# Patient Record
Sex: Male | Born: 1970 | Race: White | Hispanic: Yes | Marital: Married | Smoking: Former smoker
Health system: Southern US, Community
[De-identification: ages and names within clinical notes are randomized; demographics above are authoritative.]

## PROBLEM LIST (undated history)

## (undated) DIAGNOSIS — E119 Type 2 diabetes mellitus without complications: Secondary | ICD-10-CM

## (undated) DIAGNOSIS — E785 Hyperlipidemia, unspecified: Secondary | ICD-10-CM

---

## 2004-01-24 ENCOUNTER — Ambulatory Visit: Payer: Self-pay | Admitting: Internal Medicine

## 2004-08-31 ENCOUNTER — Emergency Department (HOSPITAL_COMMUNITY): Admission: EM | Admit: 2004-08-31 | Discharge: 2004-08-31 | Payer: Self-pay | Admitting: Emergency Medicine

## 2013-04-23 ENCOUNTER — Ambulatory Visit: Payer: Managed Care, Other (non HMO)

## 2013-04-23 ENCOUNTER — Ambulatory Visit (INDEPENDENT_AMBULATORY_CARE_PROVIDER_SITE_OTHER): Payer: Managed Care, Other (non HMO) | Admitting: Family Medicine

## 2013-04-23 VITALS — BP 130/84 | HR 100 | Temp 99.0°F | Resp 16 | Ht 69.75 in | Wt 250.0 lb

## 2013-04-23 DIAGNOSIS — M25512 Pain in left shoulder: Secondary | ICD-10-CM

## 2013-04-23 DIAGNOSIS — M25519 Pain in unspecified shoulder: Secondary | ICD-10-CM

## 2013-04-23 DIAGNOSIS — Z0000 Encounter for general adult medical examination without abnormal findings: Secondary | ICD-10-CM

## 2013-04-23 LAB — COMPREHENSIVE METABOLIC PANEL
ALT: 32 U/L (ref 0–53)
AST: 22 U/L (ref 0–37)
Albumin: 4.7 g/dL (ref 3.5–5.2)
Alkaline Phosphatase: 64 U/L (ref 39–117)
BUN: 12 mg/dL (ref 6–23)
CO2: 27 mEq/L (ref 19–32)
Calcium: 10.3 mg/dL (ref 8.4–10.5)
Chloride: 102 mEq/L (ref 96–112)
Creat: 0.83 mg/dL (ref 0.50–1.35)
Glucose, Bld: 106 mg/dL — ABNORMAL HIGH (ref 70–99)
Potassium: 4.7 mEq/L (ref 3.5–5.3)
Sodium: 137 mEq/L (ref 135–145)
Total Bilirubin: 0.4 mg/dL (ref 0.3–1.2)
Total Protein: 7.9 g/dL (ref 6.0–8.3)

## 2013-04-23 LAB — LIPID PANEL
Cholesterol: 256 mg/dL — ABNORMAL HIGH (ref 0–200)
HDL: 35 mg/dL — ABNORMAL LOW (ref >39)
Total CHOL/HDL Ratio: 7.3 Ratio
Triglycerides: 404 mg/dL — ABNORMAL HIGH (ref <150)

## 2013-04-23 LAB — POCT URINALYSIS DIPSTICK
Bilirubin, UA: NEGATIVE
Blood, UA: NEGATIVE
Glucose, UA: NEGATIVE
Ketones, UA: NEGATIVE
Leukocytes, UA: NEGATIVE
Nitrite, UA: NEGATIVE
Protein, UA: NEGATIVE
Spec Grav, UA: <=1.005
Urobilinogen, UA: 0.2
pH, UA: 6

## 2013-04-23 LAB — POCT CBC
Granulocyte percent: 67.4 %G (ref 37–80)
HCT, POC: 52 % (ref 43.5–53.7)
Hemoglobin: 17.3 g/dL (ref 14.1–18.1)
Lymph, poc: 1.8 (ref 0.6–3.4)
MCH, POC: 31.5 pg — AB (ref 27–31.2)
MCHC: 33.3 g/dL (ref 31.8–35.4)
MCV: 94.8 fL (ref 80–97)
MID (cbc): 0.4 (ref 0–0.9)
MPV: 8.5 fL (ref 0–99.8)
POC Granulocyte: 4.5 (ref 2–6.9)
POC LYMPH PERCENT: 26.4 %L (ref 10–50)
POC MID %: 6.2 %M (ref 0–12)
Platelet Count, POC: 308 10*3/uL (ref 142–424)
RBC: 5.49 M/uL (ref 4.69–6.13)
RDW, POC: 13.8 %
WBC: 6.7 10*3/uL (ref 4.6–10.2)

## 2013-04-23 MED ORDER — METHYLPREDNISOLONE (PAK) 4 MG PO TABS: ORAL_TABLET | ORAL | Status: DC

## 2013-04-23 NOTE — Patient Instructions (Signed)
Return on Tuesday at 4 pm for recheck and physical

## 2013-04-23 NOTE — Progress Notes (Signed)
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 He's coming in for evaluation of left shoulder pain which has been going on since before Thanksgiving (2 months). It began when he was scratching his back.  Patient now has crepitus and pain with abduction and raising his arm over his head.  Objective: No acute distress Left shoulder exam reveals full range of motion although patient winces as he abducts the arm and raises his hand over his shoulder height. He also has faulty reaching behind his back and touching his scapula with his left hand.  The shoulder inspection appears normal and patient has no muscle wasting or weakness. There is no tenderness about the shoulder.  [facility] reading (PRIMARY) by  Dr. Milus GlazierLauenstein: Left shoulder. No dislocation but patient does have some extra calcification around the acromion superiorly.  Assessment: Capsulitis, left shoulder  Plan: Medrol dose pack, day out of work for 2 days and recheck on Tuesday  Also we'll do fasting cholesterol today.  Signed, Lewayne BuntingKurt Johnson M.D.

## 2013-04-25 ENCOUNTER — Ambulatory Visit (INDEPENDENT_AMBULATORY_CARE_PROVIDER_SITE_OTHER): Payer: Managed Care, Other (non HMO) | Admitting: Family Medicine

## 2013-04-25 VITALS — BP 120/80 | HR 88 | Temp 98.7°F | Resp 16 | Ht 70.0 in | Wt 255.0 lb

## 2013-04-25 DIAGNOSIS — E785 Hyperlipidemia, unspecified: Secondary | ICD-10-CM

## 2013-04-25 DIAGNOSIS — Z0000 Encounter for general adult medical examination without abnormal findings: Secondary | ICD-10-CM

## 2013-04-25 MED ORDER — ATORVASTATIN CALCIUM 20 MG PO TABS: 20.0000 mg | ORAL_TABLET | Freq: Every day | ORAL | Status: AC

## 2013-04-25 NOTE — Progress Notes (Signed)
Patient ID: Robel Wuertz MRN: [id number], DOB: [birth year] 43 y.o. Date of Encounter: 04/25/2013, 4:31 PM  Primary Physician: No primary provider on file.  Chief Complaint: Physical (CPE)  HPI: 43 y.o. y/o male with history noted below here for CPE.  Doing well. 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 He's had 2 months of left shoulder pain and recently we started him on a Medrol Dosepak which is significantly reduce his pain and crepitus. He has a history of elevated cholesterol triglycerides Review of Systems: Consitutional: No fever, chills, fatigue, night sweats, lymphadenopathy, or weight changes. Eyes: No visual changes, eye redness, or discharge. ENT/Mouth: Ears: No otalgia, tinnitus, hearing loss, discharge. Nose: No congestion, rhinorrhea, sinus pain, or epistaxis. Throat: No sore throat, post nasal drip, or teeth pain. Cardiovascular: No CP, palpitations, diaphoresis, DOE, edema, orthopnea, PND. Respiratory: No cough, hemoptysis, SOB, or wheezing. Gastrointestinal: No anorexia, dysphagia, reflux, pain, nausea, vomiting, hematemesis, diarrhea, constipation, BRBPR, or melena. Genitourinary: No dysuria, frequency, urgency, hematuria, incontinence, nocturia, decreased urinary stream, discharge, impotence, or testicular pain/masses. Musculoskeletal: No decreased ROM, myalgias, stiffness, joint swelling, or weakness. Skin: No rash, erythema, lesion changes, pain, warmth, jaundice, or pruritis. Neurological: No headache, dizziness, syncope, seizures, tremors, memory loss, coordination problems, or paresthesias. Psychological: No anxiety, depression, hallucinations, SI/HI. Endocrine: No fatigue, polydipsia, polyphagia, polyuria, or known diabetes. All other systems were reviewed and are otherwise negative.  History reviewed. No pertinent past medical history.   History reviewed. No pertinent past surgical history.  Home Meds:  Prior to Admission medications     Medication Sig Start Date End Date Taking? Authorizing Provider  methylPREDNIsolone (MEDROL DOSPACK) 4 MG tablet follow package directions 04/23/13  Yes Elvina Sidle, MD  aspirin 81 MG tablet Take 81 mg by mouth daily.    Historical Provider, MD    Allergies: No Known Allergies  History   Social History  . Marital Status: Married    Spouse Name: N/A    Number of Children: N/A  . Years of Education: N/A   Occupational History  . Not on file.   Social History Main Topics  . Smoking status: Former Games developer  . Smokeless tobacco: Not on file  . Alcohol Use: Not on file  . Drug Use: Not on file  . Sexual Activity: Not on file   Other Topics Concern  . Not on file   Social History Narrative  . No narrative on file    Family History  Problem Relation Age of Onset  . Diabetes Mother   . Diabetes Maternal Grandfather   . Diabetes Paternal Grandmother   . Heart disease Paternal Grandfather     Physical Exam: Blood pressure 120/80, pulse 88, temperature 98.7 F (37.1 C), temperature source Oral, resp. rate 16, height 5\' 10"  (1.778 m), weight 255 lb (115.667 kg), SpO2 98.00%.  General: Well developed, well nourished, in no acute distress. HEENT: Normocephalic, atraumatic. Conjunctiva pink, sclera non-icteric. Pupils 2 mm constricting to 1 mm, round, regular, and equally reactive to light and accomodation. EOMI. Internal auditory canal clear. TMs with good cone of light and without pathology. Nasal mucosa pink. Nares are without discharge. No sinus tenderness. Oral mucosa pink. Dentition: Loose crown on the right. Pharynx without exudate.   Neck: Supple. Trachea midline. No thyromegaly. Full ROM. No lymphadenopathy. Lungs: Clear to auscultation bilaterally without wheezes, rales, or rhonchi. Breathing is of normal effort and unlabored. Cardiovascular: RRR with S1 S2. No murmurs, rubs, or gallops appreciated. Distal pulses 2+ symmetrically.  No carotid or abdominal bruits Abdomen:  Soft, non-tender, non-distended with normoactive bowel sounds. No hepatosplenomegaly or masses. No rebound/guarding. No CVA tenderness. Without hernias.  Genitourinary:  uncircumcised male. No penile lesions. Testes descended bilaterally, and smooth without tenderness or masses.  Musculoskeletal: Full range of motion and 5/5 strength throughout. Without swelling, atrophy, tenderness, crepitus, or warmth. Extremities without clubbing, cyanosis, or edema. Calves supple. Skin: Warm and moist without erythema, ecchymosis, wounds, or rash. Neuro: A+Ox3. CN II-XII grossly intact. Moves all extremities spontaneously. Full sensation throughout. Normal gait. DTR 2+ throughout upper and lower extremities. Finger to nose intact. Psych:  Responds to questions appropriately with a normal affect.   Results for orders placed in visit on 04/23/13  COMPREHENSIVE METABOLIC PANEL      Result Value Range   Sodium 137  135 - 145 mEq/L   Potassium 4.7  3.5 - 5.3 mEq/L   Chloride 102  96 - 112 mEq/L   CO2 27  19 - 32 mEq/L   Glucose, Bld 106 (*) 70 - 99 mg/dL   BUN 12  6 - 23 mg/dL   Creat 4.09  8.11 - 9.14 mg/dL   Total Bilirubin 0.4  0.3 - 1.2 mg/dL   Alkaline Phosphatase 64  39 - 117 U/L   AST 22  0 - 37 U/L   ALT 32  0 - 53 U/L   Total Protein 7.9  6.0 - 8.3 g/dL   Albumin 4.7  3.5 - 5.2 g/dL   Calcium 78.2  8.4 - 95.6 mg/dL  LIPID PANEL      Result Value Range   Cholesterol 256 (*) 0 - 200 mg/dL   Triglycerides 213 (*) <150 mg/dL   HDL 35 (*) >08 mg/dL   Total CHOL/HDL Ratio 7.3     VLDL NOT CALC  0 - 40 mg/dL   LDL Cholesterol    0 - 99 mg/dL  POCT CBC      Result Value Range   WBC 6.7  4.6 - 10.2 K/uL   Lymph, poc 1.8  0.6 - 3.4   POC LYMPH PERCENT 26.4  10 - 50 %L   MID (cbc) 0.4  0 - 0.9   POC MID % 6.2  0 - 12 %M   POC Granulocyte 4.5  2 - 6.9   Granulocyte percent 67.4  37 - 80 %G   RBC 5.49  4.69 - 6.13 M/uL   Hemoglobin 17.3  14.1 - 18.1 g/dL   HCT, POC 65.7  84.6 - 53.7 %   MCV  94.8  80 - 97 fL   MCH, POC 31.5 (*) 27 - 31.2 pg   MCHC 33.3  31.8 - 35.4 g/dL   RDW, POC 96.2     Platelet Count, POC 308  142 - 424 K/uL   MPV 8.5  0 - 99.8 fL  POCT URINALYSIS DIPSTICK      Result Value Range   Color, UA yellow     Clarity, UA clear     Glucose, UA neg     Bilirubin, UA neg     Ketones, UA neg     Spec Grav, UA <=1.005     Blood, UA neg     pH, UA 6.0     Protein, UA neg     Urobilinogen, UA 0.2     Nitrite, UA neg     Leukocytes, UA Negative       Assessment/Plan:  43 y.o. y/o  male here  for CPE -Other and unspecified hyperlipidemia - Plan: atorvastatin (LIPITOR) 20 MG tablet Recheck 3 months Call if shoulder continues to be a problem Signed, Elvina SidleKurt Montasia Chisenhall, MD    Signed, Elvina SidleKurt Ramaya Guile, MD 04/25/2013 4:31 PM

## 2013-04-25 NOTE — Patient Instructions (Signed)
Health Maintenance, Males A healthy lifestyle and preventative care can promote health and wellness.  Maintain regular health, dental, and eye exams.  Eat a healthy diet. Foods like vegetables, fruits, whole grains, low-fat dairy products, and lean protein foods contain the nutrients you need and are low in calories. Decrease your intake of foods high in solid fats, added sugars, and salt. Get information about a proper diet from your health care provider, if necessary.  Regular physical exercise is one of the most important things you can do for your health. Most adults should get at least 150 minutes of moderate-intensity exercise (any activity that increases your heart rate and causes you to sweat) each week. In addition, most adults need muscle-strengthening exercises on 2 or more days a week.   Maintain a healthy weight. The body mass index (BMI) is a screening tool to identify possible weight problems. It provides an estimate of body fat based on height and weight. Your health care provider can find your BMI and can help you achieve or maintain a healthy weight. For males 20 years and older:  A BMI below 18.5 is considered underweight.  A BMI of 18.5 to 24.9 is normal.  A BMI of 25 to 29.9 is considered overweight.  A BMI of 30 and above is considered obese.  Maintain normal blood lipids and cholesterol by exercising and minimizing your intake of saturated fat. Eat a balanced diet with plenty of fruits and vegetables. Blood tests for lipids and cholesterol should begin at age 20 and be repeated every 5 years. If your lipid or cholesterol levels are high, you are over 50, or you are at high risk for heart disease, you may need your cholesterol levels checked more frequently.Ongoing high lipid and cholesterol levels should be treated with medicines, if diet and exercise are not working.  If you smoke, find out from your health care provider how to quit. If you do not use tobacco, do not  start.  Lung cancer screening is recommended for adults aged 55 80 years who are at high risk for developing lung cancer because of a history of smoking. A yearly low-dose CT scan of the lungs is recommended for people who have at least a 30-pack-year history of smoking and are a current smoker or have quit within the past 15 years. A pack year of smoking is smoking an average of 1 pack of cigarettes a day for 1 year (for example, a 30-pack-year history of smoking could mean smoking 1 pack a day for 30 years or 2 packs a day for 15 years). Yearly screening should continue until the smoker has stopped smoking for at least 15 years. Yearly screening should be stopped for people who develop a health problem that would prevent them from having lung cancer treatment.  If you choose to drink alcohol, do not have more than 2 drinks per day. One drink is considered to be 12 oz (360 mL) of beer, 5 oz (150 mL) of wine, or 1.5 oz (45 mL) of liquor.  Avoid use of street drugs. Do not share needles with anyone. Ask for help if you need support or instructions about stopping the use of drugs.  High blood pressure causes heart disease and increases the risk of stroke. Blood pressure should be checked at least every 1 2 years. Ongoing high blood pressure should be treated with medicines if weight loss and exercise are not effective.  If you are 45 45 years old, ask your health   care provider if you should take aspirin to prevent heart disease.  Diabetes screening involves taking a blood sample to check your fasting blood sugar level. This should be done once every 3 years after age 45, if you are at a normal weight and without risk factors for diabetes. Testing should be considered at a younger age or be carried out more frequently if you are overweight and have at least 1 risk factor for diabetes.  Colorectal cancer can be detected and often prevented. Most routine colorectal cancer screening begins at the age of 50  and continues through age 75. However, your health care provider may recommend screening at an earlier age if you have risk factors for colon cancer. On a yearly basis, your health care provider may provide home test kits to check for hidden blood in the stool. A small camera at the end of a tube may be used to directly examine the colon (sigmoidoscopy or colonoscopy) to detect the earliest forms of colorectal cancer. Talk to your health care provider about this at age 50, when routine screening begins. A direct exam of the colon should be repeated every 5 10 years through age 75, unless early forms of pre-cancerous polyps or small growths are found.  People who are at an increased risk for hepatitis B should be screened for this virus. You are considered at high risk for hepatitis B if:  You were born in a country where hepatitis B occurs often. Talk with your health care provider about which countries are considered high-risk.  Your parents were born in a high-risk country and you have not received a shot to protect against hepatitis B (hepatitis B vaccine).  You have HIV or AIDS.  You use needles to inject street drugs.  You live with, or have sex with, someone who has hepatitis B.  You are a man who has sex with other men (MSM).  You get hemodialysis treatment.  You take certain medicines for conditions like cancer, organ transplantation, and autoimmune conditions.  Hepatitis C blood testing is recommended for all people born from 1945 through 1965 and any individual with known risk factors for hepatitis C.  Healthy men should no longer receive prostate-specific antigen (PSA) blood tests as part of routine cancer screening. Talk to your health care provider about prostate cancer screening.  Testicular cancer screening is not recommended for adolescents or adult males who have no symptoms. Screening includes self-exam, a health care provider exam, and other screening tests. Consult with  your health care provider about any symptoms you have or any concerns you have about testicular cancer.  Practice safe sex. Use condoms and avoid high-risk sexual practices to reduce the spread of sexually transmitted infections (STIs).  Use sunscreen. Apply sunscreen liberally and repeatedly throughout the day. You should seek shade when your shadow is shorter than you. Protect yourself by wearing long sleeves, pants, a wide-brimmed hat, and sunglasses year round, whenever you are outdoors.  Tell your health care provider of new moles or changes in moles, especially if there is a change in shape or color. Also tell your provider if a mole is larger than the size of a pencil eraser.  A one-time screening for abdominal aortic aneurysm (AAA) and surgical repair of large AAAs by ultrasound is recommended for men aged 65 75 years who are current or former smokers.  Stay current with your vaccines (immunizations). Document Released: 09/26/2007 Document Revised: 01/18/2013 Document Reviewed: 08/25/2010 [employer] Patient Information 2014 [employer]   Hypercholesterolemia High Blood Cholesterol Cholesterol is a white, waxy, fat-like protein needed by your body in small amounts. The liver makes all the cholesterol you need. It is carried from the liver by the blood through the blood vessels. Deposits (plaque) may build up on blood vessel walls. This makes the arteries narrower and stiffer. Plaque increases the risk for heart attack and stroke. You cannot feel your cholesterol level even if it is very high. The only way to know is by a blood test to check your lipid (fats) levels. Once you know your cholesterol levels, you should keep a record of the test results. Work with your caregiver to to keep your levels in the desired range. WHAT THE RESULTS MEAN:  Total cholesterol is a rough measure of all the cholesterol in your blood.  LDL is the so-called bad cholesterol. This is the type that deposits  cholesterol in the walls of the arteries. You want this level to be low.  HDL is the good cholesterol because it cleans the arteries and carries the LDL away. You want this level to be high.  Triglycerides are fat that the body can either burn for energy or store. High levels are closely linked to heart disease. DESIRED LEVELS:  Total cholesterol below 200.  LDL below 100 for people at risk, below 70 for very high risk.  HDL above 50 is good, above 60 is best.  Triglycerides below 150. HOW TO LOWER YOUR CHOLESTEROL:  Diet.  Choose fish or white meat chicken and [place], roasted or baked. Limit fatty cuts of red meat, fried foods, and processed meats, such as sausage and lunch meat.  Eat lots of fresh fruits and vegetables. Choose whole grains, beans, pasta, potatoes and cereals.  Use only small amounts of olive, corn or canola oils. Avoid butter, mayonnaise, shortening or palm kernel oils. Avoid foods with trans-fats.  Use skim/nonfat milk and low-fat/nonfat yogurt and cheeses. Avoid whole milk, cream, ice cream, egg yolks and cheeses. Healthy desserts include angel food cake, gingersnaps, animal crackers, hard candy, popsicles, and low-fat/nonfat frozen yogurt. Avoid pastries, cakes, pies and cookies.  Exercise.  A regular program helps decrease LDL and raises HDL.  Helps with weight control.  Do things that increase your activity level like gardening, walking, or taking the stairs.  Medication.  May be prescribed by your caregiver to help lowering cholesterol and the risk for heart disease.  You may need medicine even if your levels are normal if you have several risk factors. HOME CARE INSTRUCTIONS   Follow your diet and exercise programs as suggested by your caregiver.  Take medications as directed.  Have blood work done when your caregiver feels it is necessary. MAKE SURE YOU:   Understand these instructions.  Will watch your condition.  Will get help right  away if you are not doing well or get worse. Document Released: 03/30/2005 Document Revised: 06/22/2011 Document Reviewed: 09/15/2006 [facility] Patient Information 2014 [place], [place]. Hypertriglyceridemia  Diet for High blood levels of Triglycerides Most fats in food are triglycerides. Triglycerides in your blood are stored as fat in your body. High levels of triglycerides in your blood may put you at a greater risk for heart disease and stroke.  Normal triglyceride levels are less than 150 mg/dL. Borderline high levels are 150-199 mg/dl. High levels are 200 - 499 mg/dL, and very high triglyceride levels are greater than 500 mg/dL. The decision to treat high triglycerides is generally based on the level. For people with borderline  or high triglyceride levels, treatment includes weight loss and exercise. Drugs are recommended for people with very high triglyceride levels. Many people who need treatment for high triglyceride levels have metabolic syndrome. This syndrome is a collection of disorders that often include: insulin resistance, high blood pressure, blood clotting problems, high cholesterol and triglycerides. TESTING PROCEDURE FOR TRIGLYCERIDES  You should not eat 4 hours before getting your triglycerides measured. The normal range of triglycerides is between 10 and 250 milligrams per deciliter (mg/dl). Some people may have extreme levels (1000 or above), but your triglyceride level may be too high if it is above 150 mg/dl, depending on what other risk factors you have for heart disease.  People with high blood triglycerides may also have high blood cholesterol levels. If you have high blood cholesterol as well as high blood triglycerides, your risk for heart disease is probably greater than if you only had high triglycerides. High blood cholesterol is one of the main risk factors for heart disease. CHANGING YOUR DIET  Your weight can affect your blood triglyceride level. If you are more than  20% above your ideal body weight, you may be able to lower your blood triglycerides by losing weight. Eating less and exercising regularly is the best way to combat this. Fat provides more calories than any other food. The best way to lose weight is to eat less fat. Only 30% of your total calories should come from fat. Less than 7% of your diet should come from saturated fat. A diet low in fat and saturated fat is the same as a diet to decrease blood cholesterol. By eating a diet lower in fat, you may lose weight, lower your blood cholesterol, and lower your blood triglyceride level.  Eating a diet low in fat, especially saturated fat, may also help you lower your blood triglyceride level. Ask your dietitian to help you figure how much fat you can eat based on the number of calories your caregiver has prescribed for you.  Exercise, in addition to helping with weight loss may also help lower triglyceride levels.   Alcohol can increase blood triglycerides. You may need to stop drinking alcoholic beverages.  Too much carbohydrate in your diet may also increase your blood triglycerides. Some complex carbohydrates are necessary in your diet. These may include bread, rice, potatoes, other starchy vegetables and cereals.  Reduce "simple" carbohydrates. These may include pure sugars, candy, honey, and jelly without losing other nutrients. If you have the kind of high blood triglycerides that is affected by the amount of carbohydrates in your diet, you will need to eat less sugar and less high-sugar foods. Your caregiver can help you with this.  Adding 2-4 grams of fish oil (EPA+ DHA) may also help lower triglycerides. Speak with your caregiver before adding any supplements to your regimen. Following the Diet  Maintain your ideal weight. Your caregivers can help you with a diet. Generally, eating less food and getting more exercise will help you lose weight. Joining a weight control group may also help. Ask your  caregivers for a good weight control group in your area.  Eat low-fat foods instead of high-fat foods. This can help you lose weight too.  These foods are lower in fat. Eat MORE of these:   Dried beans, peas, and lentils.  Egg whites.  Low-fat cottage cheese.  Fish.  Lean cuts of meat, such as round, sirloin, rump, and flank (cut extra fat off meat you fix).  Whole grain breads, cereals  and pasta.  Skim and nonfat dry milk.  Low-fat yogurt.  Poultry without the skin.  Cheese made with skim or part-skim milk, such as mozzarella, parmesan, farmers', ricotta, or pot cheese. These are higher fat foods. Eat LESS of these:   Whole milk and foods made from whole milk, such as American, blue, cheddar, monterey jack, and swiss cheese  High-fat meats, such as luncheon meats, sausages, knockwurst, bratwurst, hot dogs, ribs, corned beef, ground pork, and regular ground beef.  Fried foods. Limit saturated fats in your diet. Substituting unsaturated fat for saturated fat may decrease your blood triglyceride level. You will need to read package labels to know which products contain saturated fats.  These foods are high in saturated fat. Eat LESS of these:   Fried pork skins.  Whole milk.  Skin and fat from poultry.  Palm oil.  Butter.  Shortening.  Cream cheese.  Tomasa Blase.  Margarines and baked goods made from listed oils.  Vegetable shortenings.  Chitterlings.  Fat from meats.  Coconut oil.  Palm kernel oil.  Lard.  Cream.  Sour cream.  Fatback.  Coffee whiteners and non-dairy creamers made with these oils.  Cheese made from whole milk. Use unsaturated fats (both polyunsaturated and monounsaturated) moderately. Remember, even though unsaturated fats are better than saturated fats; you still want a diet low in total fat.  These foods are high in unsaturated fat:   Canola oil.  Sunflower oil.  Mayonnaise.  Almonds.  Peanuts.  Pine nuts.  Margarines  made with these oils.  Safflower oil.  Olive oil.  Avocados.  Cashews.  Peanut butter.  Sunflower seeds.  Soybean oil.  Peanut oil.  Olives.  Pecans.  Walnuts.  Pumpkin seeds. Avoid sugar and other high-sugar foods. This will decrease carbohydrates without decreasing other nutrients. Sugar in your food goes rapidly to your blood. When there is excess sugar in your blood, your liver may use it to make more triglycerides. Sugar also contains calories without other important nutrients.  Eat LESS of these:   Sugar, brown sugar, powdered sugar, jam, jelly, preserves, honey, syrup, molasses, pies, candy, cakes, cookies, frosting, pastries, colas, soft drinks, punches, fruit drinks, and regular gelatin.  Avoid alcohol. Alcohol, even more than sugar, may increase blood triglycerides. In addition, alcohol is high in calories and low in nutrients. Ask for sparkling water, or a diet soft drink instead of an alcoholic beverage. Suggestions for planning and preparing meals   Bake, broil, grill or roast meats instead of frying.  Remove fat from meats and skin from poultry before cooking.  Add spices, herbs, lemon juice or vinegar to vegetables instead of salt, rich sauces or gravies.  Use a non-stick skillet without fat or use no-stick sprays.  Cool and refrigerate stews and broth. Then remove the hardened fat floating on the surface before serving.  Refrigerate meat drippings and skim off fat to make low-fat gravies.  Serve more fish.  Use less butter, margarine and other high-fat spreads on bread or vegetables.  Use skim or reconstituted non-fat dry milk for cooking.  Cook with low-fat cheeses.  Substitute low-fat yogurt or cottage cheese for all or part of the sour cream in recipes for sauces, dips or congealed salads.  Use half yogurt/half mayonnaise in salad recipes.  Substitute evaporated skim milk for cream. Evaporated skim milk or reconstituted non-fat dry milk can  be whipped and substituted for whipped cream in certain recipes.  Choose fresh fruits for dessert instead of high-fat foods such  as pies or cakes. Fruits are naturally low in fat. When Dining Out   Order low-fat appetizers such as fruit or vegetable juice, pasta with vegetables or tomato sauce.  Select clear, rather than cream soups.  Ask that dressings and gravies be served on the side. Then use less of them.  Order foods that are baked, broiled, poached, steamed, stir-fried, or roasted.  Ask for margarine instead of butter, and use only a small amount.  Drink sparkling water, unsweetened tea or coffee, or diet soft drinks instead of alcohol or other sweet beverages. QUESTIONS AND ANSWERS ABOUT OTHER FATS IN THE BLOOD: SATURATED FAT, TRANS FAT, AND CHOLESTEROL What is trans fat? Trans fat is a type of fat that is formed when vegetable oil is hardened through a process called hydrogenation. This process helps makes foods more solid, gives them shape, and prolongs their shelf life. Trans fats are also called hydrogenated or partially hydrogenated oils.  What do saturated fat, trans fat, and cholesterol in foods have to do with heart disease? Saturated fat, trans fat, and cholesterol in the diet all raise the level of LDL "bad" cholesterol in the blood. The higher the LDL cholesterol, the greater the risk for coronary heart disease (CHD). Saturated fat and trans fat raise LDL similarly.  What foods contain saturated fat, trans fat, and cholesterol? High amounts of saturated fat are found in animal products, such as fatty cuts of meat, chicken skin, and full-fat dairy products like butter, whole milk, cream, and cheese, and in tropical vegetable oils such as palm, palm kernel, and coconut oil. Trans fat is found in some of the same foods as saturated fat, such as vegetable shortening, some margarines (especially hard or stick margarine), crackers, cookies, baked goods, fried foods, salad  dressings, and other processed foods made with partially hydrogenated vegetable oils. Small amounts of trans fat also occur naturally in some animal products, such as milk products, beef, and lamb. Foods high in cholesterol include liver, other organ meats, egg yolks, shrimp, and full-fat dairy products. How can I use the new food label to make heart-healthy food choices? Check the Nutrition Facts panel of the food label. Choose foods lower in saturated fat, trans fat, and cholesterol. For saturated fat and cholesterol, you can also use the Percent Daily Value (%DV): 5% DV or less is low, and 20% DV or more is high. (There is no %DV for trans fat.) Use the Nutrition Facts panel to choose foods low in saturated fat and cholesterol, and if the trans fat is not listed, read the ingredients and limit products that list shortening or hydrogenated or partially hydrogenated vegetable oil, which tend to be high in trans fat. POINTS TO REMEMBER:   Discuss your risk for heart disease with your caregivers, and take steps to reduce risk factors.  Change your diet. Choose foods that are low in saturated fat, trans fat, and cholesterol.  Add exercise to your daily routine if it is not already being done. Participate in physical activity of moderate intensity, like brisk walking, for at least 30 minutes on most, and preferably all days of the week. No time? Break the 30 minutes into three, 10-minute segments during the day.  Stop smoking. If you do smoke, contact your caregiver to discuss ways in which they can help you quit.  Do not use street drugs.  Maintain a normal weight.  Maintain a healthy blood pressure.  Keep up with your blood work for checking the fats in your  blood as directed by your caregiver. Document Released: 01/16/2004 Document Revised: 09/29/2011 Document Reviewed: 08/13/2008 [facility] Patient Information 2014 [place], [place].   Results for orders placed in visit on 04/23/13    COMPREHENSIVE METABOLIC PANEL      Result Value Range   Sodium 137  135 - 145 mEq/L   Potassium 4.7  3.5 - 5.3 mEq/L   Chloride 102  96 - 112 mEq/L   CO2 27  19 - 32 mEq/L   Glucose, Bld 106 (*) 70 - 99 mg/dL   BUN 12  6 - 23 mg/dL   Creat 4.540.83  0.980.50 - 1.191.35 mg/dL   Total Bilirubin 0.4  0.3 - 1.2 mg/dL   Alkaline Phosphatase 64  39 - 117 U/L   AST 22  0 - 37 U/L   ALT 32  0 - 53 U/L   Total Protein 7.9  6.0 - 8.3 g/dL   Albumin 4.7  3.5 - 5.2 g/dL   Calcium 14.710.3  8.4 - 82.910.5 mg/dL  LIPID PANEL      Result Value Range   Cholesterol 256 (*) 0 - 200 mg/dL   Triglycerides 562404 (*) <150 mg/dL   HDL 35 (*) >13>39 mg/dL   Total CHOL/HDL Ratio 7.3     VLDL NOT CALC  0 - 40 mg/dL   LDL Cholesterol    0 - 99 mg/dL  POCT CBC      Result Value Range   WBC 6.7  4.6 - 10.2 K/uL   Lymph, poc 1.8  0.6 - 3.4   POC LYMPH PERCENT 26.4  10 - 50 %L   MID (cbc) 0.4  0 - 0.9   POC MID % 6.2  0 - 12 %M   POC Granulocyte 4.5  2 - 6.9   Granulocyte percent 67.4  37 - 80 %G   RBC 5.49  4.69 - 6.13 M/uL   Hemoglobin 17.3  14.1 - 18.1 g/dL   HCT, POC 08.652.0  57.843.5 - 53.7 %   MCV 94.8  80 - 97 fL   MCH, POC 31.5 (*) 27 - 31.2 pg   MCHC 33.3  31.8 - 35.4 g/dL   RDW, POC 46.913.8     Platelet Count, POC 308  142 - 424 K/uL   MPV 8.5  0 - 99.8 fL  POCT URINALYSIS DIPSTICK      Result Value Range   Color, UA yellow     Clarity, UA clear     Glucose, UA neg     Bilirubin, UA neg     Ketones, UA neg     Spec Grav, UA <=1.005     Blood, UA neg     pH, UA 6.0     Protein, UA neg     Urobilinogen, UA 0.2     Nitrite, UA neg     Leukocytes, UA Negative

## 2014-02-23 DIAGNOSIS — E119 Type 2 diabetes mellitus without complications: Secondary | ICD-10-CM | POA: Insufficient documentation

## 2015-04-18 ENCOUNTER — Ambulatory Visit (INDEPENDENT_AMBULATORY_CARE_PROVIDER_SITE_OTHER): Payer: Managed Care, Other (non HMO) | Admitting: Emergency Medicine

## 2015-04-18 ENCOUNTER — Ambulatory Visit (INDEPENDENT_AMBULATORY_CARE_PROVIDER_SITE_OTHER): Payer: Managed Care, Other (non HMO)

## 2015-04-18 VITALS — BP 134/88 | HR 89 | Temp 98.4°F | Resp 16 | Ht 70.0 in | Wt 256.4 lb

## 2015-04-18 DIAGNOSIS — M722 Plantar fascial fibromatosis: Secondary | ICD-10-CM | POA: Diagnosis not present

## 2015-04-18 DIAGNOSIS — F17200 Nicotine dependence, unspecified, uncomplicated: Secondary | ICD-10-CM | POA: Diagnosis not present

## 2015-04-18 DIAGNOSIS — F17210 Nicotine dependence, cigarettes, uncomplicated: Secondary | ICD-10-CM | POA: Insufficient documentation

## 2015-04-18 MED ORDER — NAPROXEN SODIUM 550 MG PO TABS: 550.0000 mg | ORAL_TABLET | Freq: Two times a day (BID) | ORAL | Status: AC

## 2015-04-18 MED ORDER — TRAMADOL HCL 50 MG PO TABS
50.0000 mg | ORAL_TABLET | Freq: Three times a day (TID) | ORAL | Status: DC | PRN
Start: 2015-04-18 — End: 2020-07-29

## 2015-04-18 NOTE — Patient Instructions (Signed)
Plantar Fasciitis Plantar fasciitis is a painful foot condition that affects the heel. It occurs when the band of tissue that connects the toes to the heel bone (plantar fascia) becomes irritated. This can happen after exercising too much or doing other repetitive activities (overuse injury). The pain from plantar fasciitis can range from mild irritation to severe pain that makes it difficult for you to walk or move. The pain is usually worse in the morning or after you have been sitting or lying down for a while. CAUSES This condition may be caused by:  Standing for long periods of time.  Wearing shoes that do not fit.  Doing high-impact activities, including running, aerobics, and ballet.  Being overweight.  Having an abnormal way of walking (gait).  Having tight calf muscles.  Having high arches in your feet.  Starting a new athletic activity. SYMPTOMS The main symptom of this condition is heel pain. Other symptoms include:  Pain that gets worse after activity or exercise.  Pain that is worse in the morning or after resting.  Pain that goes away after you walk for a few minutes. DIAGNOSIS This condition may be diagnosed based on your signs and symptoms. Your health care provider will also do a physical exam to check for:  A tender area on the bottom of your foot.  A high arch in your foot.  Pain when you move your foot.  Difficulty moving your foot. You may also need to have imaging studies to confirm the diagnosis. These can include:  X-rays.  Ultrasound.  MRI. TREATMENT  Treatment for plantar fasciitis depends on the severity of the condition. Your treatment may include:  Rest, ice, and over-the-counter pain medicines to manage your pain.  Exercises to stretch your calves and your plantar fascia.  A splint that holds your foot in a stretched, upward position while you sleep (night splint).  Physical therapy to relieve symptoms and prevent problems in the  future.  Cortisone injections to relieve severe pain.  Extracorporeal shock wave therapy (ESWT) to stimulate damaged plantar fascia with electrical impulses. It is often used as a last resort before surgery.  Surgery, if other treatments have not worked after 12 months. HOME CARE INSTRUCTIONS  Take medicines only as directed by your health care provider.  Avoid activities that cause pain.  Roll the bottom of your foot over a bag of ice or a bottle of cold water. Do this for 20 minutes, 3-4 times a day.  Perform simple stretches as directed by your health care provider.  Try wearing athletic shoes with air-sole or gel-sole cushions or soft shoe inserts.  Wear a night splint while sleeping, if directed by your health care provider.  Keep all follow-up appointments with your health care provider. PREVENTION   Do not perform exercises or activities that cause heel pain.  Consider finding low-impact activities if you continue to have problems.  Lose weight if you need to. The best way to prevent plantar fasciitis is to avoid the activities that aggravate your plantar fascia. SEEK MEDICAL CARE IF:  Your symptoms do not go away after treatment with home care measures.  Your pain gets worse.  Your pain affects your ability to move or do your daily activities.   This information is not intended to replace advice given to you by your health care provider. Make sure you discuss any questions you have with your health care provider.   Document Released: 12/23/2000 Document Revised: 12/19/2014 Document Reviewed: 02/07/2014 Elsevier   Interactive Patient Education 2016 [employer]  

## 2015-04-18 NOTE — Progress Notes (Signed)
Subjective:  Patient ID: Scott Duarte, male    DOB: 03-25-1971  Age: 45 y.o. MRN: [id number]  CC: Heel pain   HPI Scott Duarte presents  is been under treatment by family doctor for "plantar fasciitis." As pain in the morning that is severe when he bears weight but the pain diminishes but doesn't go away the rest of day. Pain is located in his left heel. He has no history of injury or overuse. Denies any improvement with over with the medications prescribed by his regular doctor. He'Duarte been using supportive techniques like ice and rolling on a bottle without any improvement.  History Scott Duarte has no past medical history on file.   He has no past surgical history on file.   His  family history includes Diabetes in his maternal grandfather, mother, and paternal grandmother; Heart disease in his paternal grandfather.  He   reports that he has quit smoking. He does not have any smokeless tobacco history on file. His alcohol and drug histories are not on file.  Outpatient Prescriptions Prior to Visit  Medication Sig Dispense Refill  . aspirin 81 MG tablet Take 81 mg by mouth daily. Reported on 04/18/2015    . atorvastatin (LIPITOR) 20 MG tablet Take 1 tablet (20 mg total) by mouth daily. (Patient not taking: Reported on 04/18/2015) 90 tablet 3  . methylPREDNIsolone (MEDROL DOSPACK) 4 MG tablet follow package directions (Patient not taking: Reported on 04/18/2015) 21 tablet 0   No facility-administered medications prior to visit.    Social History   Social History  . Marital Status: Married    Spouse Name: N/A  . Number of Children: N/A  . Years of Education: N/A   Social History Main Topics  . Smoking status: Former Games developer  . Smokeless tobacco: None  . Alcohol Use: None  . Drug Use: None  . Sexual Activity: Not Asked   Other Topics Concern  . None   Social History Narrative     Review of Systems  Constitutional: Negative for fever, chills and appetite change.  HENT: Negative for  congestion, ear pain, postnasal drip, sinus pressure and sore throat.   Eyes: Negative for pain and redness.  Respiratory: Negative for cough, shortness of breath and wheezing.   Cardiovascular: Negative for leg swelling.  Gastrointestinal: Negative for nausea, vomiting, abdominal pain, diarrhea, constipation and blood in stool.  Endocrine: Negative for polyuria.  Genitourinary: Negative for dysuria, urgency, frequency and flank pain.  Musculoskeletal: Negative for gait problem.  Skin: Negative for rash.  Neurological: Negative for weakness and headaches.  Psychiatric/Behavioral: Negative for confusion and decreased concentration. The patient is not nervous/anxious.     Objective:  BP 134/88 mmHg  Pulse 89  Temp(Src) 98.4 F (36.9 C) (Oral)  Resp 16  Ht 5\' 10"  (1.778 m)  Wt 256 lb 6.4 oz (116.302 kg)  BMI 36.79 kg/m2  SpO2 99%  Physical Exam  Constitutional: He is oriented to person, place, and time. He appears well-developed and well-nourished.  HENT:  Head: Normocephalic and atraumatic.  Eyes: Conjunctivae are normal. Pupils are equal, round, and reactive to light.  Pulmonary/Chest: Effort normal.  Musculoskeletal: He exhibits no edema.  Neurological: He is alert and oriented to person, place, and time.  Skin: Skin is dry.  Psychiatric: He has a normal mood and affect. His behavior is normal. Thought content normal.      Assessment & Plan:   Scott Duarte was seen today for heel pain.  Diagnoses and all orders for  this visit:  Plantar fasciitis, left -     DG Foot Complete Left; Future -     Ambulatory referral to Podiatry  Tobacco use disorder  Other orders -     naproxen sodium (ANAPROX DS) 550 MG tablet; Take 1 tablet (550 mg total) by mouth 2 (two) times daily with a meal. -     traMADol (ULTRAM) 50 MG tablet; Take 1 tablet (50 mg total) by mouth every 8 (eight) hours as needed.  I am having Mr. Scott Duarte start on naproxen sodium and traMADol. I am also having him  maintain his aspirin, methylPREDNIsolone, atorvastatin, metFORMIN, and fenofibrate.  Meds ordered this encounter  Medications  . metFORMIN (GLUMETZA) 500 MG (MOD) 24 hr tablet    Sig: Take 500 mg by mouth daily with breakfast.  . fenofibrate (TRICOR) 145 MG tablet    Sig: Take 145 mg by mouth daily.  . naproxen sodium (ANAPROX DS) 550 MG tablet    Sig: Take 1 tablet (550 mg total) by mouth 2 (two) times daily with a meal.    Dispense:  40 tablet    Refill:  0  . traMADol (ULTRAM) 50 MG tablet    Sig: Take 1 tablet (50 mg total) by mouth every 8 (eight) hours as needed.    Dispense:  30 tablet    Refill:  0   he was treated with medication referred to podiatry   Appropriate red flag conditions were discussed with the patient as well as actions that should be taken.  Patient expressed his understanding.  Follow-up: Return if symptoms worsen or fail to improve.  Carmelina DaneAnderson, Scott S, MD   [facility] reading (PRIMARY) by  Dr. Dareen PianoAnderson.  negative.

## 2019-06-13 ENCOUNTER — Encounter: Payer: Self-pay | Admitting: Nurse Practitioner

## 2020-07-27 ENCOUNTER — Emergency Department (HOSPITAL_COMMUNITY): Payer: Managed Care, Other (non HMO)

## 2020-07-27 ENCOUNTER — Inpatient Hospital Stay (HOSPITAL_COMMUNITY): Payer: Managed Care, Other (non HMO)

## 2020-07-27 ENCOUNTER — Inpatient Hospital Stay (HOSPITAL_COMMUNITY)
Admission: EM | Admit: 2020-07-27 | Discharge: 2020-07-29 | DRG: 254 | Disposition: A | Discharge status: Home / Self Care | Payer: Managed Care, Other (non HMO) | Attending: Vascular Surgery | Admitting: Vascular Surgery

## 2020-07-27 ENCOUNTER — Encounter (HOSPITAL_COMMUNITY): Payer: Self-pay | Admitting: Emergency Medicine

## 2020-07-27 ENCOUNTER — Other Ambulatory Visit: Payer: Self-pay

## 2020-07-27 DIAGNOSIS — Z8249 Family history of ischemic heart disease and other diseases of the circulatory system: Secondary | ICD-10-CM | POA: Diagnosis not present

## 2020-07-27 DIAGNOSIS — Z7982 Long term (current) use of aspirin: Secondary | ICD-10-CM | POA: Diagnosis not present

## 2020-07-27 DIAGNOSIS — I724 Aneurysm of artery of lower extremity: Secondary | ICD-10-CM | POA: Diagnosis present

## 2020-07-27 DIAGNOSIS — M79662 Pain in left lower leg: Secondary | ICD-10-CM | POA: Diagnosis present

## 2020-07-27 DIAGNOSIS — M79605 Pain in left leg: Secondary | ICD-10-CM

## 2020-07-27 DIAGNOSIS — E785 Hyperlipidemia, unspecified: Secondary | ICD-10-CM | POA: Diagnosis present

## 2020-07-27 DIAGNOSIS — Z01810 Encounter for preprocedural cardiovascular examination: Secondary | ICD-10-CM

## 2020-07-27 DIAGNOSIS — I70245 Atherosclerosis of native arteries of left leg with ulceration of other part of foot: Secondary | ICD-10-CM

## 2020-07-27 DIAGNOSIS — M79669 Pain in unspecified lower leg: Secondary | ICD-10-CM

## 2020-07-27 DIAGNOSIS — Z79899 Other long term (current) drug therapy: Secondary | ICD-10-CM

## 2020-07-27 DIAGNOSIS — Z20822 Contact with and (suspected) exposure to covid-19: Secondary | ICD-10-CM | POA: Diagnosis present

## 2020-07-27 DIAGNOSIS — Z833 Family history of diabetes mellitus: Secondary | ICD-10-CM | POA: Diagnosis not present

## 2020-07-27 DIAGNOSIS — Z87891 Personal history of nicotine dependence: Secondary | ICD-10-CM | POA: Diagnosis not present

## 2020-07-27 DIAGNOSIS — Z7984 Long term (current) use of oral hypoglycemic drugs: Secondary | ICD-10-CM

## 2020-07-27 HISTORY — DX: Type 2 diabetes mellitus without complications: E11.9

## 2020-07-27 HISTORY — DX: Hyperlipidemia, unspecified: E78.5

## 2020-07-27 LAB — CBC WITH DIFFERENTIAL/PLATELET
Abs Immature Granulocytes: 0.04 10*3/uL (ref 0.00–0.07)
Basophils Absolute: 0.1 10*3/uL (ref 0.0–0.1)
Basophils Relative: 1 %
Eosinophils Absolute: 0.1 10*3/uL (ref 0.0–0.5)
Eosinophils Relative: 1 %
HCT: 45.9 % (ref 39.0–52.0)
Hemoglobin: 15.9 g/dL (ref 13.0–17.0)
Immature Granulocytes: 0 %
Lymphocytes Relative: 23 %
Lymphs Abs: 2.3 10*3/uL (ref 0.7–4.0)
MCH: 31.1 pg (ref 26.0–34.0)
MCHC: 34.6 g/dL (ref 30.0–36.0)
MCV: 89.8 fL (ref 80.0–100.0)
Monocytes Absolute: 0.7 10*3/uL (ref 0.1–1.0)
Monocytes Relative: 7 %
Neutro Abs: 6.8 10*3/uL (ref 1.7–7.7)
Neutrophils Relative %: 68 %
Platelets: 248 10*3/uL (ref 150–400)
RBC: 5.11 MIL/uL (ref 4.22–5.81)
RDW: 13.1 % (ref 11.5–15.5)
WBC: 10 10*3/uL (ref 4.0–10.5)
nRBC: 0 % (ref 0.0–0.2)

## 2020-07-27 LAB — COMPREHENSIVE METABOLIC PANEL
ALT: 31 U/L (ref 0–44)
AST: 27 U/L (ref 15–41)
Albumin: 4.1 g/dL (ref 3.5–5.0)
Alkaline Phosphatase: 54 U/L (ref 38–126)
Anion gap: 11 (ref 5–15)
BUN: 10 mg/dL (ref 6–20)
CO2: 19 mmol/L — ABNORMAL LOW (ref 22–32)
Calcium: 9.5 mg/dL (ref 8.9–10.3)
Chloride: 106 mmol/L (ref 98–111)
Creatinine, Ser: 0.65 mg/dL (ref 0.61–1.24)
GFR, Estimated: >60 mL/min (ref >60)
Glucose, Bld: 154 mg/dL — ABNORMAL HIGH (ref 70–99)
Potassium: 4.3 mmol/L (ref 3.5–5.1)
Sodium: 136 mmol/L (ref 135–145)
Total Bilirubin: 1 mg/dL (ref 0.3–1.2)
Total Protein: 7 g/dL (ref 6.5–8.1)

## 2020-07-27 LAB — GLUCOSE, CAPILLARY
Glucose-Capillary: 132 mg/dL — ABNORMAL HIGH (ref 70–99)
Glucose-Capillary: 192 mg/dL — ABNORMAL HIGH (ref 70–99)

## 2020-07-27 LAB — URINALYSIS, ROUTINE W REFLEX MICROSCOPIC
Bilirubin Urine: NEGATIVE
Glucose, UA: NEGATIVE mg/dL
Hgb urine dipstick: NEGATIVE
Ketones, ur: NEGATIVE mg/dL
Leukocytes,Ua: NEGATIVE
Nitrite: NEGATIVE
Protein, ur: NEGATIVE mg/dL
Specific Gravity, Urine: 1.036 — ABNORMAL HIGH (ref 1.005–1.030)
pH: 5 (ref 5.0–8.0)

## 2020-07-27 LAB — RESP PANEL BY RT-PCR (FLU A&B, COVID) ARPGX2
Influenza A by PCR: NEGATIVE
Influenza B by PCR: NEGATIVE
SARS Coronavirus 2 by RT PCR: NEGATIVE

## 2020-07-27 LAB — HEPARIN LEVEL (UNFRACTIONATED): Heparin Unfractionated: 0.12 IU/mL — ABNORMAL LOW (ref 0.30–0.70)

## 2020-07-27 LAB — SURGICAL PCR SCREEN
MRSA, PCR: NEGATIVE
Staphylococcus aureus: NEGATIVE

## 2020-07-27 MED ORDER — PANTOPRAZOLE SODIUM 40 MG PO TBEC
40.0000 mg | DELAYED_RELEASE_TABLET | Freq: Every day | ORAL | Status: DC
  Administered 2020-07-27 – 2020-07-29 (×3): 40 mg via ORAL
  Filled 2020-07-27 (×3): qty 1

## 2020-07-27 MED ORDER — HEPARIN BOLUS VIA INFUSION
3000.0000 [IU] | Freq: Once | INTRAVENOUS | Status: AC
  Administered 2020-07-27: 3000 [IU] via INTRAVENOUS
  Filled 2020-07-27: qty 3000

## 2020-07-27 MED ORDER — IOHEXOL 350 MG/ML SOLN
100.0000 mL | Freq: Once | INTRAVENOUS | Status: AC | PRN
  Administered 2020-07-27: 100 mL via INTRAVENOUS

## 2020-07-27 MED ORDER — ATORVASTATIN CALCIUM 10 MG PO TABS
20.0000 mg | ORAL_TABLET | Freq: Every day | ORAL | Status: DC
  Administered 2020-07-27 – 2020-07-29 (×3): 20 mg via ORAL
  Filled 2020-07-27 (×3): qty 2

## 2020-07-27 MED ORDER — INSULIN ASPART 100 UNIT/ML ~~LOC~~ SOLN
4.0000 [IU] | Freq: Three times a day (TID) | SUBCUTANEOUS | Status: DC
  Administered 2020-07-28 – 2020-07-29 (×3): 4 [IU] via SUBCUTANEOUS

## 2020-07-27 MED ORDER — INSULIN ASPART 100 UNIT/ML ~~LOC~~ SOLN: 0.0000 [IU] | Freq: Every day | SUBCUTANEOUS | Status: DC

## 2020-07-27 MED ORDER — ONDANSETRON HCL 4 MG/2ML IJ SOLN: 4.0000 mg | Freq: Four times a day (QID) | INTRAMUSCULAR | Status: DC | PRN

## 2020-07-27 MED ORDER — SODIUM CHLORIDE 0.9 % IV SOLN: INTRAVENOUS | Status: DC

## 2020-07-27 MED ORDER — POTASSIUM CHLORIDE CRYS ER 20 MEQ PO TBCR: 20.0000 meq | EXTENDED_RELEASE_TABLET | Freq: Once | ORAL | Status: DC

## 2020-07-27 MED ORDER — INSULIN ASPART 100 UNIT/ML ~~LOC~~ SOLN
0.0000 [IU] | Freq: Three times a day (TID) | SUBCUTANEOUS | Status: DC
  Administered 2020-07-27: 3 [IU] via SUBCUTANEOUS
  Administered 2020-07-28: 4 [IU] via SUBCUTANEOUS
  Administered 2020-07-28: 7 [IU] via SUBCUTANEOUS
  Administered 2020-07-28 – 2020-07-29 (×2): 4 [IU] via SUBCUTANEOUS

## 2020-07-27 MED ORDER — METOPROLOL TARTRATE 5 MG/5ML IV SOLN: 2.0000 mg | INTRAVENOUS | Status: DC | PRN

## 2020-07-27 MED ORDER — MORPHINE SULFATE (PF) 2 MG/ML IV SOLN: 2.0000 mg | INTRAVENOUS | Status: DC | PRN

## 2020-07-27 MED ORDER — ALUM & MAG HYDROXIDE-SIMETH 200-200-20 MG/5ML PO SUSP: 15.0000 mL | ORAL | Status: DC | PRN

## 2020-07-27 MED ORDER — HEPARIN (PORCINE) 25000 UT/250ML-% IV SOLN
2100.0000 [IU]/h | INTRAVENOUS | Status: DC
  Administered 2020-07-27: 1800 [IU]/h via INTRAVENOUS
  Administered 2020-07-27: 1500 [IU]/h via INTRAVENOUS
  Filled 2020-07-27 (×2): qty 250

## 2020-07-27 MED ORDER — HYDRALAZINE HCL 20 MG/ML IJ SOLN: 5.0000 mg | INTRAMUSCULAR | Status: DC | PRN

## 2020-07-27 MED ORDER — LABETALOL HCL 5 MG/ML IV SOLN: 10.0000 mg | INTRAVENOUS | Status: DC | PRN

## 2020-07-27 MED ORDER — PHENOL 1.4 % MT LIQD
1.0000 | OROMUCOSAL | Status: DC | PRN
  Filled 2020-07-27: qty 177

## 2020-07-27 MED ORDER — ASPIRIN EC 81 MG PO TBEC
81.0000 mg | DELAYED_RELEASE_TABLET | Freq: Every day | ORAL | Status: DC
  Administered 2020-07-27 – 2020-07-29 (×3): 81 mg via ORAL
  Filled 2020-07-27 (×3): qty 1

## 2020-07-27 MED ORDER — OXYCODONE-ACETAMINOPHEN 5-325 MG PO TABS
1.0000 | ORAL_TABLET | ORAL | Status: DC | PRN
  Administered 2020-07-28 – 2020-07-29 (×3): 2 via ORAL
  Filled 2020-07-27 (×3): qty 2

## 2020-07-27 MED ORDER — GUAIFENESIN-DM 100-10 MG/5ML PO SYRP: 15.0000 mL | ORAL_SOLUTION | ORAL | Status: DC | PRN

## 2020-07-27 MED ORDER — CEFAZOLIN SODIUM-DEXTROSE 2-4 GM/100ML-% IV SOLN
2.0000 g | INTRAVENOUS | Status: AC
  Administered 2020-07-28: 2 g via INTRAVENOUS
  Filled 2020-07-27 (×2): qty 100

## 2020-07-27 MED ORDER — HEPARIN BOLUS VIA INFUSION
5700.0000 [IU] | Freq: Once | INTRAVENOUS | Status: AC
  Administered 2020-07-27: 5700 [IU] via INTRAVENOUS
  Filled 2020-07-27: qty 5700

## 2020-07-27 NOTE — ED Notes (Signed)
Patient transported to CT by this RN 

## 2020-07-27 NOTE — Progress Notes (Signed)
ANTICOAGULATION CONSULT NOTE  Pharmacy Consult for heparin Indication: DVT  No Known Allergies  Patient Measurements: Weight: 108 kg (238 lb) Heparin Dosing Weight: 95kg  Vital Signs: Temp: 97.8 F (36.6 C) (04/16 2023) Temp Source: Oral (04/16 2023) BP: 130/91 (04/16 2023) Pulse Rate: 63 (04/16 2023)  Labs: Recent Labs    07/27/20 1124 07/27/20 2202  HGB 15.9  --   HCT 45.9  --   PLT 248  --   HEPARINUNFRC  --  0.12*  CREATININE 0.65  --     CrCl cannot be calculated (Unknown ideal weight.).   Medical History: History reviewed. No pertinent past medical history.  Assessment: 44 YOM presenting with leg pain, DVT r/o, not on anticoagulation PTA.  CBC wnl Initial heparin level 0.12 units/ml  Goal of Therapy:  Heparin level 0.3-0.7 units/ml Monitor platelets by anticoagulation protocol: Yes   Plan:  Heparin 3000 units IV x 1, and gtt at 1800 units/hr F/u heparin level in 6 hours F/u long term AC plan  Thanks for allowing pharmacy to be a part of this patient's care.  Talbert Cage, PharmD Clinical Pharmacist  07/27/2020 11:00 PM

## 2020-07-27 NOTE — Progress Notes (Signed)
ANTICOAGULATION CONSULT NOTE - Initial Consult  Pharmacy Consult for heparin Indication: DVT  No Known Allergies  Patient Measurements:   Heparin Dosing Weight: 95kg  Vital Signs: Temp: 98.6 F (37 C) (04/16 1022) Temp Source: Oral (04/16 1022) BP: 138/74 (04/16 1315) Pulse Rate: 79 (04/16 1315)  Labs: Recent Labs    07/27/20 1124  HGB 15.9  HCT 45.9  PLT 248  CREATININE 0.65    CrCl cannot be calculated (Unknown ideal weight.).   Medical History: History reviewed. No pertinent past medical history.  Assessment: 42 YOM presenting with leg pain, DVT r/o, not on anticoagulation PTA.  CBC wnl  Goal of Therapy:  Heparin level 0.3-0.7 units/ml Monitor platelets by anticoagulation protocol: Yes   Plan:  Heparin 5700 units IV x 1, and gtt at 1500 units/hr F/u heparin level in 6 hours F/u long term [facility] plan  Daylene Posey, PharmD Clinical Pharmacist ED Pharmacist Phone # [phone] 07/27/2020 1:32 PM

## 2020-07-27 NOTE — ED Provider Notes (Signed)
[facility] EMERGENCY DEPARTMENT Provider Note   CSN: [id number] Arrival date & time: 07/27/20  1017     History Chief Complaint  Patient presents with  . Leg Pain    Tiandre Teall is a 50 y.o. male.  The history is provided by the patient and medical records.  Leg Pain  Tyson Parkison is a 50 y.o. male who presents to the Emergency Department complaining of leg pain.  He presents to the ED upon referral from urgent care for evaluation of left leg pain. He was working in the yard on Tuesday when he noticed pain in the left calf. He had some mild associated swelling. Overall that has improved. This morning he noticed that half of his foot was purple/blue this morning and he presented to the urgent care for evaluation. Urgent care referred him to the emergency department for evaluation. He states that overall the color change has resolved. When he was at urgent care his great toe was slightly red and cool to the touch at that time. He has a history of diabetes. He smokes tobacco. He has no history of clots. No injuries.  Denies fever, chest pain, sob, abdominal pain, NVD    History reviewed. No pertinent past medical history.  Patient Active Problem List   Diagnosis Date Noted  . 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 Tobacco use disorder 04/18/2015    History reviewed. No pertinent surgical history.     Family History  Problem Relation Age of Onset  . Diabetes Mother   . Diabetes Maternal Grandfather   . Diabetes Paternal Grandmother   . Heart disease Paternal Grandfather     Social History   Tobacco Use  . Smoking status: Former Games developer  . Smokeless tobacco: Never Used    Home Medications Prior to Admission medications   Medication Sig Start Date End Date Taking? Authorizing Provider  atorvastatin (LIPITOR) 20 MG tablet Take 1 tablet (20 mg total) by mouth daily. 04/25/13  Yes Elvina Sidle, MD  meloxicam (MOBIC) 15 MG tablet Take 1 tablet by  mouth daily. 07/12/20  Yes [provider]  metFORMIN (GLUMETZA) 500 MG (MOD) 24 hr tablet Take 500 mg by mouth daily with breakfast.   Yes [provider]  aspirin 81 MG tablet Take 81 mg by mouth daily. Reported on 04/18/2015 Patient not taking: Reported on 07/27/2020    [provider]  methylPREDNIsolone (MEDROL DOSPACK) 4 MG tablet follow package directions Patient not taking: Reported on 07/27/2020 04/23/13   Elvina Sidle, MD  traMADol (ULTRAM) 50 MG tablet Take 1 tablet (50 mg total) by mouth every 8 (eight) hours as needed. Patient not taking: Reported on 07/27/2020 04/18/15   Carmelina Dane, MD    Allergies    Patient has no known allergies.  Review of Systems   Review of Systems  All other systems reviewed and are negative.   Physical Exam Updated Vital Signs BP 121/87 (BP Location: Left Arm)   Pulse 79   Temp 99.1 F (37.3 C) (Oral)   Resp 19   Wt 108 kg   SpO2 97%   BMI 34.15 kg/m   Physical Exam Vitals and nursing note reviewed.  Constitutional:      Appearance: He is well-developed.  HENT:     Head: Normocephalic and atraumatic.  Cardiovascular:     Rate and Rhythm: Normal rate and regular rhythm.     Heart sounds: No murmur heard.   Pulmonary:  Effort: Pulmonary effort is normal. No respiratory distress.     Breath sounds: Normal breath sounds.  Abdominal:     Palpations: Abdomen is soft.     Tenderness: There is no abdominal tenderness. There is no guarding or rebound.  Musculoskeletal:        General: No tenderness.     Comments: 2+ DP pulses bilaterally. The distal half of the left great toe is cool to the touch with brisk cap refill.  Skin:    General: Skin is warm and dry.  Neurological:     Mental Status: He is alert and oriented to person, place, and time.  Psychiatric:        Behavior: Behavior normal.     ED Results / Procedures / Treatments   Labs (all labs ordered are listed, but only abnormal results  are displayed) Labs Reviewed  COMPREHENSIVE METABOLIC PANEL - Abnormal; Notable for the following components:      Result Value   CO2 19 (*)    Glucose, Bld 154 (*)    All other components within normal limits  GLUCOSE, CAPILLARY - Abnormal; Notable for the following components:   Glucose-Capillary 132 (*)    All other components within normal limits  RESP PANEL BY RT-PCR (FLU A&B, COVID) ARPGX2  CBC WITH DIFFERENTIAL/PLATELET  HEPARIN LEVEL (UNFRACTIONATED)  URINALYSIS, ROUTINE W REFLEX MICROSCOPIC  HEPARIN LEVEL (UNFRACTIONATED)  CBC  BASIC METABOLIC PANEL  HIV ANTIBODY (ROUTINE TESTING W REFLEX)  HEMOGLOBIN A1C    EKG None  Radiology CT Angio Aortobifemoral W and/or Wo Contrast  Result Date: 07/27/2020 CLINICAL DATA:  Left leg pain for 1 week. Blue toes. Venous Doppler positive for DVT and possible popliteal artery aneurysm. EXAM: CT ANGIOGRAPHY OF ABDOMINAL AORTA WITH ILIOFEMORAL RUNOFF TECHNIQUE: Multidetector CT imaging of the abdomen, pelvis and lower extremities was performed using the standard protocol during bolus administration of intravenous contrast. Multiplanar CT image reconstructions and MIPs were obtained to evaluate the vascular anatomy. CONTRAST:  100mL OMNIPAQUE IOHEXOL 350 MG/ML SOLN COMPARISON:  None. FINDINGS: VASCULAR Aorta: Normal caliber aorta without aneurysm, dissection, vasculitis or significant stenosis. Celiac: Patent without evidence of aneurysm, dissection, vasculitis or significant stenosis. SMA: Patent without evidence of aneurysm, dissection, vasculitis or significant stenosis. Renals: Both renal arteries are patent without evidence of aneurysm, dissection, vasculitis, fibromuscular dysplasia or significant stenosis. IMA: Patent without evidence of aneurysm, dissection, vasculitis or significant stenosis. RIGHT Lower Extremity Inflow: Common, internal and external iliac arteries are patent without evidence of aneurysm, dissection, vasculitis or  significant stenosis. Outflow: No significant abnormality of the common femoral or Profunda femoris arteries. There is approximately 60% focal stenosis of the mid SFA due to atheromatous plaque. No significant narrowing of the popliteal artery. Runoff: No significant narrowing of the tibioperoneal trunk. Two vessel runoff to the ankle through the peroneal and posterior tibial arteries. The anterior tibial artery opacifies to the distal lower leg, however the dorsalis pedis artery is predominantly supplied by the peroneal. LEFT Lower Extremity Inflow: Common, internal and external iliac arteries are patent without evidence of aneurysm, dissection, vasculitis or significant stenosis. Outflow: No significant abnormality of the common femoral, superficial femoral, or profunda femoris arteries. There is aneurysmal dilatation of the popliteal artery measuring 3.4 x 2.6 cm. Runoff: Tibioperoneal trunk is patent. Three-vessel runoff to the left ankle. Veins: No obvious venous abnormality within the limitations of this arterial phase study. Review of the MIP images confirms the above findings. NON-VASCULAR Lower chest: Multiple nodules noted in the right  lung base on series 5: Image 18-right middle lobe-4 mm Image 22-right middle lobe-4 mm Image 29-right lower lobe-5 mm Hepatobiliary: Diffuse low-density of hepatic parenchyma indicative of steatosis. No focal hepatic lesion. Nonspecific mild diffuse thickening of the gallbladder wall. Pancreas: Unremarkable. No pancreatic ductal dilatation or surrounding inflammatory changes. Spleen: Normal in size without focal abnormality. Adrenals/Urinary Tract: Adrenal glands are normal in appearance. Subcentimeter hypodense lesion in the upper pole of the left kidney is too small to fully characterize. Kidneys and ureters otherwise unremarkable. Bladder is unremarkable. Stomach/Bowel: Stomach is within normal limits. Appendix is not definitively identified. No evidence of bowel wall  thickening, distention, or inflammatory changes. Lymphatic: No enlarged abdominal or pelvic lymph nodes. Reproductive: Moderately enlarged prostate. Other: No abdominal wall hernia or abnormality. No abdominopelvic ascites. Musculoskeletal: No acute or significant osseous findings. IMPRESSION: VASCULAR 1. 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 2. 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 NON-VASCULAR Multiple noncalcified right lower lobe lung nodules measuring up to 5 mm. No follow-up needed if patient is low-risk (and has no known or suspected primary neoplasm). Non-contrast chest CT can be considered in 12 months if patient is high-risk. This recommendation follows the consensus statement: Guidelines for Management of Incidental Pulmonary Nodules Detected on CT Images: From the Fleischner Society 2017; Radiology 2017; 284:228-243. Electronically Signed   By: Acquanetta Belling M.D.   On: 07/27/2020 14:32   VAS [place] LOWER EXTREMITY SAPHENOUS VEIN MAPPING  Result Date: 07/27/2020 LOWER EXTREMITY VEIN MAPPING Indications:  Pre-op Risk Factors: PAD.  Comparison Study: No prior study Performing Technologist: Gertie Fey MHA, RDMS, RVT, RDCS  Examination Guidelines: A complete evaluation includes B-mode imaging, spectral Doppler, color Doppler, and power Doppler as needed of all accessible portions of each vessel. Bilateral testing is considered an integral part of a complete examination. Limited examinations for reoccurring indications may be performed as noted. +---------------+-----------+----------------------+---------------+-----------+   RT Diameter  RT Findings         GSV            LT Diameter  LT Findings      (cm)                                            (cm)                  +---------------+-----------+----------------------+---------------+-----------+      0.66                     Saphenofemoral         0.57                                                    Junction                                  +---------------+-----------+----------------------+---------------+-----------+      0.40                     Proximal thigh         0.47       branching  +---------------+-----------+----------------------+---------------+-----------+  0.33       branching       Mid thigh            0.39                  +---------------+-----------+----------------------+---------------+-----------+      0.37                      Distal thigh          0.43                  +---------------+-----------+----------------------+---------------+-----------+      0.35                          Knee              0.45                  +---------------+-----------+----------------------+---------------+-----------+      0.35                       Prox calf            0.31                  +---------------+-----------+----------------------+---------------+-----------+      0.31       branching        Mid calf            0.28                  +---------------+-----------+----------------------+---------------+-----------+      0.21                      Distal calf           0.27       branching  +---------------+-----------+----------------------+---------------+-----------+      0.31                         Ankle              0.24                  +---------------+-----------+----------------------+---------------+-----------+ +----------------+--------------+--------------+----------------+--------------+ RT diameter (cm) RT Findings       SSV      LT Diameter (cm) LT Findings   +----------------+--------------+--------------+----------------+--------------+       0.18                      Popliteal                   not visualized                                   fossa                                     +----------------+--------------+--------------+----------------+--------------+       0.15                    Proximal calf                 not visualized +----------------+--------------+--------------+----------------+--------------+  not visualized   Mid calf                   not visualized +----------------+--------------+--------------+----------------+--------------+                 not visualized Distal calf                  not visualized +----------------+--------------+--------------+----------------+--------------+    Preliminary    VAS [place] LOWER EXTREMITY VENOUS (DVT) (ONLY [facility] & [facility])  Result Date: 07/27/2020  Lower Venous DVT Study Indications: Patient states left leg pain X 1 week (calf to toes) and toes "look bluish.".  Limitations: Body habitus. Comparison Study: No prior study on file Performing Technologist: Sherren Kerns RVS  Examination Guidelines: A complete evaluation includes B-mode imaging, spectral Doppler, color Doppler, and power Doppler as needed of all accessible portions of each vessel. Bilateral testing is considered an integral part of a complete examination. Limited examinations for reoccurring indications may be performed as noted. The reflux portion of the exam is performed with the patient in reverse Trendelenburg.  +-----+---------------+---------+-----------+----------+--------------+ RIGHTCompressibilityPhasicitySpontaneityPropertiesThrombus Aging +-----+---------------+---------+-----------+----------+--------------+ CFV  Full           Yes      Yes                                 +-----+---------------+---------+-----------+----------+--------------+   +---------+---------------+---------+-----------+----------+-------------------+ LEFT     CompressibilityPhasicitySpontaneityPropertiesThrombus Aging      +---------+---------------+---------+-----------+----------+-------------------+ CFV      Full           Yes       Yes                                      +---------+---------------+---------+-----------+----------+-------------------+ SFJ      Full                                                             +---------+---------------+---------+-----------+----------+-------------------+ FV Prox  Full                                                             +---------+---------------+---------+-----------+----------+-------------------+ FV Mid   Full                                                             +---------+---------------+---------+-----------+----------+-------------------+ FV DistalFull                                                             +---------+---------------+---------+-----------+----------+-------------------+ PFV      Full                                                             +---------+---------------+---------+-----------+----------+-------------------+  POP      Full           Yes      Yes                                      +---------+---------------+---------+-----------+----------+-------------------+ PTV                                                   Not well visualized +---------+---------------+---------+-----------+----------+-------------------+ PERO                                                  Not well visualized +---------+---------------+---------+-----------+----------+-------------------+     Summary: RIGHT: - No evidence of common femoral vein obstruction.  LEFT: - There is no evidence of deep vein thrombosis in the lower extremity. However, portions of this examination were limited- see technologist comments above.  - Incidentally, there is popliteal artery aneurysm noted. Cannot rule out thrombotic material.  *See table(s) above for measurements and observations.    Preliminary     Procedures Procedures  CRITICAL CARE Performed by: Tilden Fossa   Total critical care time: 35  minutes  Critical care time was exclusive of separately billable procedures and treating other patients.  Critical care was necessary to treat or prevent imminent or life-threatening deterioration.  Critical care was time spent personally by me on the following activities: development of treatment plan with patient and/or surrogate as well as nursing, discussions with consultants, evaluation of patient's response to treatment, examination of patient, obtaining history from patient or surrogate, ordering and performing treatments and interventions, ordering and review of laboratory studies, ordering and review of radiographic studies, pulse oximetry and re-evaluation of patient's condition.  Medications Ordered in ED Medications  heparin ADULT infusion 100 units/mL (25000 units/263mL) (1,500 Units/hr Intravenous New Bag/Given 07/27/20 1413)  aspirin EC tablet 81 mg (has no administration in time range)  atorvastatin (LIPITOR) tablet 20 mg (has no administration in time range)  potassium chloride SA (KLOR-CON) CR tablet 20-40 mEq (20 mEq Oral Not Given 07/27/20 1813)  ondansetron (ZOFRAN) injection 4 mg (has no administration in time range)  alum & mag hydroxide-simeth (MAALOX/MYLANTA) 200-200-20 MG/5ML suspension 15-30 mL (has no administration in time range)  pantoprazole (PROTONIX) EC tablet 40 mg (has no administration in time range)  labetalol (NORMODYNE) injection 10 mg (has no administration in time range)  hydrALAZINE (APRESOLINE) injection 5 mg (has no administration in time range)  metoprolol tartrate (LOPRESSOR) injection 2-5 mg (has no administration in time range)  guaiFENesin-dextromethorphan (ROBITUSSIN DM) 100-10 MG/5ML syrup 15 mL (has no administration in time range)  phenol (CHLORASEPTIC) mouth spray 1 spray (has no administration in time range)  oxyCODONE-acetaminophen (PERCOCET/ROXICET) 5-325 MG per tablet 1-2 tablet (has no administration in time range)  morphine 2 MG/ML  injection 2-5 mg (has no administration in time range)  0.9 %  sodium chloride infusion ( Intravenous New Bag/Given 07/27/20 1849)  ceFAZolin (ANCEF) IVPB 2g/100 mL premix (has no administration in time range)  insulin aspart (novoLOG) injection 0-20 Units (has no administration in time range)  insulin aspart (novoLOG) injection 0-5 Units (has no administration  in time range)  insulin aspart (novoLOG) injection 4 Units (4 Units Subcutaneous Not Given 07/27/20 1915)  iohexol (OMNIPAQUE) 350 MG/ML injection 100 mL (100 mLs Intravenous Contrast Given 07/27/20 1358)  heparin bolus via infusion 5,700 Units (5,700 Units Intravenous Bolus from Bag 07/27/20 1414)    ED Course  I have reviewed the triage vital signs and the nursing notes.  Pertinent labs & imaging results that were available during my care of the patient were reviewed by me and considered in my medical decision making (see chart for details).    MDM Rules/Calculators/A&P                         patient here for evaluation of left leg pain with discoloration to his toes prior to ED presentation. Overall the color to his toes is improving. Vascular ultrasound is negative for DVT but is concerning for popliteal aneurysm. He has brisk pedal pulses with a cool toe that has brisk Refill. Discussed with Dr. Randie Heinz with vascular surgery. Plan to obtain CT aortagram and start heparin for possible embolic event.  CTA c/w popliteal aneurysm with thrombus.  Will admit for further treatment.     Final Clinical Impression(s) / ED Diagnoses Final diagnoses:  Popliteal artery aneurysm [facility])    Rx / DC Orders ED Discharge Orders    None       Tilden Fossa, MD 07/27/20 1924

## 2020-07-27 NOTE — ED Triage Notes (Signed)
Pt. Stated, pain in my left leg for almost a week , in my calf all the way to toes and my toes look bluish.

## 2020-07-27 NOTE — Progress Notes (Signed)
Bilateral lower extremity vein mapping completed. Refer to "CV Proc" under chart review to view preliminary results.  07/27/2020 4:05 PM Eula Fried., MHA, RVT, RDCS, RDMS

## 2020-07-27 NOTE — ED Notes (Signed)
EKG given to Dr. Rees 

## 2020-07-27 NOTE — Consult Note (Deleted)
Hospital Consult    Reason for Consult:  Left popliteal artery aneursym Referring Physician:  Dr. Madilyn Hook MRN #:  [id number]  History of Present Illness: This is a 50 y.o. male without previous medical history states that for 1 week he has had left calf and foot pain.  He was working Tuesday when he noticed the pain initially.  This morning he noticed discoloration of his toes he was evaluated in urgent care sent to the emergency department for further evaluation.  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  He has now been started on heparin and CT angio ordered.  Patient states that he is currently not having any pain.  The discoloration is not associated with any numbness or tingling and he has full motor intact of his left leg.  He is on heparin now but does not take any blood thinners.  He denies any history of stroke, TIA or amaurosis.  He does not know of any personal or family history of aneurysm disease.  History reviewed. No pertinent past medical history.  History reviewed. No pertinent surgical history.  No Known Allergies  Prior to Admission medications   Medication Sig Start Date End Date Taking? Authorizing Provider  aspirin 81 MG tablet Take 81 mg by mouth daily. Reported on 04/18/2015    [provider]  atorvastatin (LIPITOR) 20 MG tablet Take 1 tablet (20 mg total) by mouth daily. Patient not taking: Reported on 04/18/2015 04/25/13   Elvina Sidle, MD  fenofibrate (TRICOR) 145 MG tablet Take 145 mg by mouth daily.    [provider]  metFORMIN (GLUMETZA) 500 MG (MOD) 24 hr tablet Take 500 mg by mouth daily with breakfast.    [provider]  methylPREDNIsolone (MEDROL DOSPACK) 4 MG tablet follow package directions Patient not taking: Reported on 04/18/2015 04/23/13   Elvina Sidle, MD  traMADol (ULTRAM) 50 MG tablet Take 1 tablet (50 mg total) by mouth every 8 (eight) hours  as needed. 04/18/15   Carmelina Dane, MD    Social History   Socioeconomic History  . Marital status: Married    Spouse name: Not on file  . Number of children: Not on file  . Years of education: Not on file  . Highest education level: Not on file  Occupational History  . Not on file  Tobacco Use  . Smoking status: Former Games developer  . Smokeless tobacco: Never Used  Substance and Sexual Activity  . Alcohol use: Not on file  . Drug use: Not on file  . Sexual activity: Not on file  Other Topics Concern  . Not on file  Social History Narrative  . Not on file   Social Determinants of Health   Financial Resource Strain: Not on file  Food Insecurity: Not on file  Transportation Needs: Not on file  Physical Activity: Not on file  Stress: Not on file  Social Connections: Not on file  Intimate Partner Violence: Not on file     Family History  Problem Relation Age of Onset  . Diabetes Mother   . Diabetes Maternal Grandfather   . Diabetes Paternal Grandmother   . Heart disease Paternal Grandfather     ROS: Cardiovascular: []  chest pain/pressure []  palpitations []  SOB lying flat []  DOE []  pain in legs while walking []  pain in legs at rest []  pain in legs at night []  non-healing ulcers []  hx of DVT [x]  swelling  in legs  Pulmonary: []  productive cough []  asthma/wheezing []  home O2  Neurologic: []  weakness in []  arms []  legs []  numbness in []  arms []  legs []  hx of CVA []  mini stroke [] difficulty speaking or slurred speech []  temporary loss of vision in one eye []  dizziness  Hematologic: []  hx of cancer []  bleeding problems []  problems with blood clotting easily  Endocrine:   []  diabetes []  thyroid disease  GI []  vomiting blood []  blood in stool  GU: []  CKD/renal failure []  HD--[]  M/W/F or []  T/T/S []  burning with urination []  blood in urine  Psychiatric: []  anxiety []  depression  Musculoskeletal: []  arthritis []  joint  pain  Integumentary: []  rashes [x]  discoloration left toes  Constitutional: []  fever []  chills   Physical Examination  Vitals:   07/27/20 1245 07/27/20 1315  BP: 125/90 138/74  Pulse: 67 79  Resp: 13 17  Temp:    SpO2: 97% 98%   Body mass index is 34.15 kg/m.  General:  nad HENT: WNL, normocephalic Pulmonary: normal non-labored breathing Cardiac: Palpable radial, common femoral pulses bilaterally Strong right popliteal and dorsalis pedis pulses Left popliteal pulse is 2+ and widened, 2+ palpable left dorsalis pedis pulse with 1+ posterior tibial pulse Abdomen:  soft, NT/ND, no masses Extremities: Blotchy discoloration right toes mostly affecting the right great toe with sluggish capillary refill of the great toe but the foot is warm and well-perfused Neurologic: A&O X 3; Appropriate Affect ; no sensory or motor dysfunction of either foot  CBC    Component Value Date/Time   WBC 10.0 07/27/2020 1124   RBC 5.11 07/27/2020 1124   HGB 15.9 07/27/2020 1124   HCT 45.9 07/27/2020 1124   PLT 248 07/27/2020 1124   MCV 89.8 07/27/2020 1124   MCV 94.8 04/23/2013 0858   MCH 31.1 07/27/2020 1124   MCHC 34.6 07/27/2020 1124   RDW 13.1 07/27/2020 1124   LYMPHSABS 2.3 07/27/2020 1124   MONOABS 0.7 07/27/2020 1124   EOSABS 0.1 07/27/2020 1124   BASOSABS 0.1 07/27/2020 1124    BMET    Component Value Date/Time   NA 136 07/27/2020 1124   K 4.3 07/27/2020 1124   CL 106 07/27/2020 1124   CO2 19 (L) 07/27/2020 1124   GLUCOSE 154 (H) 07/27/2020 1124   BUN 10 07/27/2020 1124   CREATININE 0.65 07/27/2020 1124   CREATININE 0.83 04/23/2013 0840   CALCIUM 9.5 07/27/2020 1124   GFRNONAA >60 07/27/2020 1124    COAGS: No results found for: INR, PROTIME   Non-Invasive Vascular Imaging:   +-----+---------------+---------+-----------+----------+--------------+  RIGHTCompressibilityPhasicitySpontaneityPropertiesThrombus Aging   +-----+---------------+---------+-----------+----------+--------------+  CFV Full      Yes   Yes                  +-----+---------------+---------+-----------+----------+--------------+         +---------+---------------+---------+-----------+----------+---------------  ----+  LEFT   CompressibilityPhasicitySpontaneityPropertiesThrombus Aging     +---------+---------------+---------+-----------+----------+---------------  ----+  CFV   Full      Yes   Yes                     +---------+---------------+---------+-----------+----------+---------------  ----+  SFJ   Full                                  +---------+---------------+---------+-----------+----------+---------------  ----+  FV Prox Full                                  +---------+---------------+---------+-----------+----------+---------------  ----+  FV Mid  Full                                  +---------+---------------+---------+-----------+----------+---------------  ----+  FV DistalFull                                  +---------+---------------+---------+-----------+----------+---------------  ----+  PFV   Full                                  +---------+---------------+---------+-----------+----------+---------------  ----+  POP   Full      Yes   Yes                     +---------+---------------+---------+-----------+----------+---------------  ----+  PTV                           Not well  visualized  +---------+---------------+---------+-----------+----------+---------------  ----+  PERO                           Not well  visualized   +---------+---------------+---------+-----------+----------+---------------  ----+      Summary:  RIGHT:  - No evidence of common femoral vein obstruction.    LEFT:  - There is no evidence of deep vein thrombosis in the lower extremity.  However, portions of this examination were limited- see technologist  comments above.    - Incidentally, there is popliteal artery aneurysm noted. Cannot rule out  thrombotic material.    CT angio IMPRESSION: VASCULAR  1. 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 2. 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   ASSESSMENT/PLAN: This is a 50 y.o. male here with partially thrombosed left popliteal artery aneurysms which appears to be showering clot distally in his foot.  He is now on heparin he denies any pain does have palpable distal pulses.  I have recommended given his young age that he needs bypass from his SFA or above-knee popliteal artery to his below-knee popliteal artery.  I have offered alternatives of stenting and noninterventional therapy which would include anticoagulation but given his current symptomatic aneurysm I think would be with high risk of continued symptoms and possible amputation.  I discussed the risk and benefits of surgery which would be possible injury to surrounding structures, future thrombosis of the bypass graft that would threaten his limb, possible requirement of blood transfusion and risk to his heart and lungs during surgery.  He demonstrates good understanding.  He will be n.p.o. past midnight we will plan for left lower extremity bypass tomorrow.  Vein mapping has been ordered.  Continue heparin drip until the time of surgery.  Jena Tegeler C. Randie Heinz, MD [facility] Office: [phone] Pager: [phone]

## 2020-07-27 NOTE — Progress Notes (Signed)
VASCULAR LAB    Left lower extremity venous duplex has been performed.  See CV proc for preliminary results.  Called results to Dr. Alla German, [facility], RVT 07/27/2020, 12:49 PM

## 2020-07-28 ENCOUNTER — Inpatient Hospital Stay (HOSPITAL_COMMUNITY): Payer: Managed Care, Other (non HMO) | Admitting: Anesthesiology

## 2020-07-28 ENCOUNTER — Encounter (HOSPITAL_COMMUNITY)
Admission: EM | Disposition: A | Discharge status: Home / Self Care | Payer: Self-pay | Source: Home / Self Care | Attending: Vascular Surgery

## 2020-07-28 ENCOUNTER — Encounter (HOSPITAL_COMMUNITY): Payer: Self-pay | Admitting: Vascular Surgery

## 2020-07-28 DIAGNOSIS — I724 Aneurysm of artery of lower extremity: Secondary | ICD-10-CM | POA: Diagnosis not present

## 2020-07-28 HISTORY — PX: FEMORAL-POPLITEAL BYPASS GRAFT: SHX937

## 2020-07-28 HISTORY — PX: VEIN HARVEST: SHX6363

## 2020-07-28 LAB — BASIC METABOLIC PANEL
Anion gap: 9 (ref 5–15)
BUN: 7 mg/dL (ref 6–20)
CO2: 23 mmol/L (ref 22–32)
Calcium: 9.2 mg/dL (ref 8.9–10.3)
Chloride: 104 mmol/L (ref 98–111)
Creatinine, Ser: 0.64 mg/dL (ref 0.61–1.24)
GFR, Estimated: >60 mL/min (ref >60)
Glucose, Bld: 183 mg/dL — ABNORMAL HIGH (ref 70–99)
Potassium: 3.4 mmol/L — ABNORMAL LOW (ref 3.5–5.1)
Sodium: 136 mmol/L (ref 135–145)

## 2020-07-28 LAB — GLUCOSE, CAPILLARY
Glucose-Capillary: 165 mg/dL — ABNORMAL HIGH (ref 70–99)
Glucose-Capillary: 209 mg/dL — ABNORMAL HIGH (ref 70–99)
Glucose-Capillary: 236 mg/dL — ABNORMAL HIGH (ref 70–99)
Glucose-Capillary: 191 mg/dL — ABNORMAL HIGH (ref 70–99)
Glucose-Capillary: 185 mg/dL — ABNORMAL HIGH (ref 70–99)

## 2020-07-28 LAB — HEPARIN LEVEL (UNFRACTIONATED): Heparin Unfractionated: 0.17 IU/mL — ABNORMAL LOW (ref 0.30–0.70)

## 2020-07-28 LAB — HIV ANTIBODY (ROUTINE TESTING W REFLEX): HIV Screen 4th Generation wRfx: NONREACTIVE

## 2020-07-28 SURGERY — BYPASS GRAFT FEMORAL-POPLITEAL ARTERY
Anesthesia: General | Site: Leg Lower | Laterality: Left

## 2020-07-28 MED ORDER — CHLORHEXIDINE GLUCONATE 0.12 % MT SOLN
OROMUCOSAL | Status: AC
  Filled 2020-07-28: qty 15

## 2020-07-28 MED ORDER — 0.9 % SODIUM CHLORIDE (POUR BTL) OPTIME
TOPICAL | Status: DC | PRN
  Administered 2020-07-28: 2000 mL

## 2020-07-28 MED ORDER — ACETAMINOPHEN 325 MG PO TABS: 325.0000 mg | ORAL_TABLET | ORAL | Status: DC | PRN

## 2020-07-28 MED ORDER — MIDAZOLAM HCL 2 MG/2ML IJ SOLN
INTRAMUSCULAR | Status: AC
  Filled 2020-07-28: qty 2

## 2020-07-28 MED ORDER — HYDRALAZINE HCL 20 MG/ML IJ SOLN: 5.0000 mg | INTRAMUSCULAR | Status: DC | PRN

## 2020-07-28 MED ORDER — HEPARIN BOLUS VIA INFUSION
2000.0000 [IU] | Freq: Once | INTRAVENOUS | Status: AC
  Administered 2020-07-28: 2000 [IU] via INTRAVENOUS
  Filled 2020-07-28: qty 2000

## 2020-07-28 MED ORDER — FENTANYL CITRATE (PF) 250 MCG/5ML IJ SOLN
INTRAMUSCULAR | Status: AC
  Filled 2020-07-28: qty 5

## 2020-07-28 MED ORDER — SODIUM CHLORIDE 0.9 % IV SOLN: 500.0000 mL | Freq: Once | INTRAVENOUS | Status: DC | PRN

## 2020-07-28 MED ORDER — POLYETHYLENE GLYCOL 3350 17 G PO PACK: 17.0000 g | PACK | Freq: Every day | ORAL | Status: DC | PRN

## 2020-07-28 MED ORDER — HYDROMORPHONE HCL 1 MG/ML IJ SOLN
INTRAMUSCULAR | Status: AC
  Filled 2020-07-28: qty 0.5

## 2020-07-28 MED ORDER — FENTANYL CITRATE (PF) 250 MCG/5ML IJ SOLN
INTRAMUSCULAR | Status: DC | PRN
  Administered 2020-07-28 (×3): 50 ug via INTRAVENOUS
  Administered 2020-07-28: 100 ug via INTRAVENOUS

## 2020-07-28 MED ORDER — HEPARIN (PORCINE) 25000 UT/250ML-% IV SOLN: 2100.0000 [IU]/h | INTRAVENOUS | Status: DC

## 2020-07-28 MED ORDER — SODIUM CHLORIDE 0.9 % IV SOLN
INTRAVENOUS | Status: DC | PRN
  Administered 2020-07-28: 500 mL

## 2020-07-28 MED ORDER — PROTAMINE SULFATE 10 MG/ML IV SOLN
INTRAVENOUS | Status: DC | PRN
  Administered 2020-07-28: 50 mg via INTRAVENOUS

## 2020-07-28 MED ORDER — METOPROLOL TARTRATE 5 MG/5ML IV SOLN: 2.0000 mg | INTRAVENOUS | Status: DC | PRN

## 2020-07-28 MED ORDER — BISACODYL 5 MG PO TBEC: 5.0000 mg | DELAYED_RELEASE_TABLET | Freq: Every day | ORAL | Status: DC | PRN

## 2020-07-28 MED ORDER — FENTANYL CITRATE (PF) 100 MCG/2ML IJ SOLN
25.0000 ug | INTRAMUSCULAR | Status: DC | PRN
  Administered 2020-07-28: 50 ug via INTRAVENOUS

## 2020-07-28 MED ORDER — CEFAZOLIN SODIUM-DEXTROSE 2-4 GM/100ML-% IV SOLN
2.0000 g | Freq: Three times a day (TID) | INTRAVENOUS | Status: AC
  Administered 2020-07-28 – 2020-07-29 (×2): 2 g via INTRAVENOUS
  Filled 2020-07-28 (×2): qty 100

## 2020-07-28 MED ORDER — LACTATED RINGERS IV SOLN: INTRAVENOUS | Status: DC

## 2020-07-28 MED ORDER — DEXAMETHASONE SODIUM PHOSPHATE 10 MG/ML IJ SOLN
INTRAMUSCULAR | Status: DC | PRN
  Administered 2020-07-28: 5 mg via INTRAVENOUS

## 2020-07-28 MED ORDER — LIDOCAINE 2% (20 MG/ML) 5 ML SYRINGE
INTRAMUSCULAR | Status: DC | PRN
  Administered 2020-07-28: 100 mg via INTRAVENOUS

## 2020-07-28 MED ORDER — PROPOFOL 10 MG/ML IV BOLUS
INTRAVENOUS | Status: DC | PRN
  Administered 2020-07-28: 180 mg via INTRAVENOUS
  Administered 2020-07-28: 20 mg via INTRAVENOUS

## 2020-07-28 MED ORDER — HYDROMORPHONE HCL 1 MG/ML IJ SOLN
INTRAMUSCULAR | Status: DC | PRN
  Administered 2020-07-28: .5 mg via INTRAVENOUS

## 2020-07-28 MED ORDER — DOCUSATE SODIUM 100 MG PO CAPS
100.0000 mg | ORAL_CAPSULE | Freq: Every day | ORAL | Status: DC
  Administered 2020-07-29: 100 mg via ORAL
  Filled 2020-07-28: qty 1

## 2020-07-28 MED ORDER — ONDANSETRON HCL 4 MG/2ML IJ SOLN
INTRAMUSCULAR | Status: DC | PRN
  Administered 2020-07-28: 4 mg via INTRAVENOUS

## 2020-07-28 MED ORDER — HYDROMORPHONE HCL 1 MG/ML IJ SOLN
INTRAMUSCULAR | Status: AC
  Administered 2020-07-28: 0.5 mg
  Filled 2020-07-28: qty 1

## 2020-07-28 MED ORDER — SUGAMMADEX SODIUM 200 MG/2ML IV SOLN
INTRAVENOUS | Status: DC | PRN
  Administered 2020-07-28: 220 mg via INTRAVENOUS

## 2020-07-28 MED ORDER — PAPAVERINE HCL 30 MG/ML IJ SOLN
INTRAMUSCULAR | Status: AC
  Filled 2020-07-28: qty 2

## 2020-07-28 MED ORDER — LACTATED RINGERS IV SOLN: INTRAVENOUS | Status: DC | PRN

## 2020-07-28 MED ORDER — HYDROMORPHONE HCL 1 MG/ML IJ SOLN: 0.2500 mg | INTRAMUSCULAR | Status: DC | PRN

## 2020-07-28 MED ORDER — HEPARIN SODIUM (PORCINE) 1000 UNIT/ML IJ SOLN
INTRAMUSCULAR | Status: DC | PRN
  Administered 2020-07-28: 10000 [IU] via INTRAVENOUS

## 2020-07-28 MED ORDER — HEMOSTATIC AGENTS (NO CHARGE) OPTIME
TOPICAL | Status: DC | PRN
  Administered 2020-07-28: 1 via TOPICAL

## 2020-07-28 MED ORDER — ROCURONIUM BROMIDE 10 MG/ML (PF) SYRINGE
PREFILLED_SYRINGE | INTRAVENOUS | Status: DC | PRN
  Administered 2020-07-28: 30 mg via INTRAVENOUS
  Administered 2020-07-28 (×2): 50 mg via INTRAVENOUS

## 2020-07-28 MED ORDER — MIDAZOLAM HCL 5 MG/5ML IJ SOLN
INTRAMUSCULAR | Status: DC | PRN
  Administered 2020-07-28: 2 mg via INTRAVENOUS

## 2020-07-28 MED ORDER — POTASSIUM CHLORIDE CRYS ER 20 MEQ PO TBCR: 20.0000 meq | EXTENDED_RELEASE_TABLET | Freq: Every day | ORAL | Status: DC | PRN

## 2020-07-28 MED ORDER — FENTANYL CITRATE (PF) 100 MCG/2ML IJ SOLN
INTRAMUSCULAR | Status: AC
  Filled 2020-07-28: qty 2

## 2020-07-28 MED ORDER — CHLORHEXIDINE GLUCONATE 0.12 % MT SOLN
15.0000 mL | Freq: Once | OROMUCOSAL | Status: AC
  Administered 2020-07-28: 15 mL via OROMUCOSAL

## 2020-07-28 MED ORDER — SODIUM CHLORIDE 0.9 % IV SOLN
INTRAVENOUS | Status: AC
  Filled 2020-07-28: qty 1.2

## 2020-07-28 MED ORDER — SODIUM CHLORIDE 0.9 % IV SOLN: INTRAVENOUS | Status: DC

## 2020-07-28 MED ORDER — MAGNESIUM SULFATE 2 GM/50ML IV SOLN
2.0000 g | Freq: Every day | INTRAVENOUS | Status: DC | PRN
Start: 2020-07-28 — End: 2020-07-29

## 2020-07-28 MED ORDER — ACETAMINOPHEN 650 MG RE SUPP
325.0000 mg | RECTAL | Status: DC | PRN
Start: 2020-07-28 — End: 2020-07-29

## 2020-07-28 SURGICAL SUPPLY — 57 items
ADH SKN CLS APL DERMABOND .7 (GAUZE/BANDAGES/DRESSINGS) ×4
BANDAGE ESMARK 6X9 LF (GAUZE/BANDAGES/DRESSINGS) IMPLANT
BNDG CMPR 9X6 STRL LF SNTH (GAUZE/BANDAGES/DRESSINGS)
BNDG ESMARK 6X9 LF (GAUZE/BANDAGES/DRESSINGS)
CANISTER SUCT 3000ML PPV (MISCELLANEOUS) ×2 IMPLANT
CANNULA VESSEL 3MM 2 BLNT TIP (CANNULA) ×2 IMPLANT
CLIP VESOCCLUDE MED 24/CT (CLIP) ×2 IMPLANT
CLIP VESOCCLUDE SM WIDE 24/CT (CLIP) ×2 IMPLANT
COVER PROBE W GEL 5X96 (DRAPES) ×2 IMPLANT
COVER WAND RF STERILE (DRAPES) ×2 IMPLANT
CUFF TOURN SGL QUICK 24 (TOURNIQUET CUFF)
CUFF TOURN SGL QUICK 34 (TOURNIQUET CUFF)
CUFF TOURN SGL QUICK 42 (TOURNIQUET CUFF) IMPLANT
CUFF TRNQT CYL 24X4X16.5-23 (TOURNIQUET CUFF) IMPLANT
CUFF TRNQT CYL 34X4.125X (TOURNIQUET CUFF) IMPLANT
DERMABOND ADVANCED (GAUZE/BANDAGES/DRESSINGS) ×4
DERMABOND ADVANCED .7 DNX12 (GAUZE/BANDAGES/DRESSINGS) ×4 IMPLANT
DRAIN CHANNEL 15F RND FF W/TCR (WOUND CARE) IMPLANT
DRAPE C-ARM 42X72 X-RAY (DRAPES) IMPLANT
DRAPE HALF SHEET 40X57 (DRAPES) IMPLANT
DRAPE X-RAY CASS 24X20 (DRAPES) IMPLANT
ELECT REM PT RETURN 9FT ADLT (ELECTROSURGICAL) ×2
ELECTRODE REM PT RTRN 9FT ADLT (ELECTROSURGICAL) ×1 IMPLANT
EVACUATOR SILICONE 100CC (DRAIN) IMPLANT
GLOVE BIO SURGEON STRL SZ7.5 (GLOVE) ×2 IMPLANT
GOWN STRL REUS W/ TWL LRG LVL3 (GOWN DISPOSABLE) ×2 IMPLANT
GOWN STRL REUS W/ TWL XL LVL3 (GOWN DISPOSABLE) ×1 IMPLANT
GOWN STRL REUS W/TWL LRG LVL3 (GOWN DISPOSABLE) ×4
GOWN STRL REUS W/TWL XL LVL3 (GOWN DISPOSABLE) ×2
HEMOSTAT SNOW SURGICEL 2X4 (HEMOSTASIS) IMPLANT
INSERT FOGARTY SM (MISCELLANEOUS) IMPLANT
KIT BASIN OR (CUSTOM PROCEDURE TRAY) ×2 IMPLANT
KIT TURNOVER KIT B (KITS) ×2 IMPLANT
MARKER GRAFT CORONARY BYPASS (MISCELLANEOUS) IMPLANT
NS IRRIG 1000ML POUR BTL (IV SOLUTION) ×4 IMPLANT
PACK PERIPHERAL VASCULAR (CUSTOM PROCEDURE TRAY) ×2 IMPLANT
PAD ARMBOARD 7.5X6 YLW CONV (MISCELLANEOUS) ×4 IMPLANT
POWDER SURGICEL 3.0 GRAM (HEMOSTASIS) ×2 IMPLANT
SET COLLECT BLD 21X3/4 12 (NEEDLE) IMPLANT
STOPCOCK 4 WAY LG BORE MALE ST (IV SETS) IMPLANT
SUT ETHILON 3 0 PS 1 (SUTURE) IMPLANT
SUT MNCRL AB 4-0 PS2 18 (SUTURE) ×6 IMPLANT
SUT PROLENE 5 0 C 1 24 (SUTURE) ×6 IMPLANT
SUT PROLENE 6 0 BV (SUTURE) ×4 IMPLANT
SUT PROLENE 7 0 BV 1 (SUTURE) IMPLANT
SUT SILK 2 0 SH (SUTURE) ×2 IMPLANT
SUT SILK 3 0 (SUTURE)
SUT SILK 3-0 18XBRD TIE 12 (SUTURE) IMPLANT
SUT VIC AB 2-0 CT1 27 (SUTURE) ×8
SUT VIC AB 2-0 CT1 TAPERPNT 27 (SUTURE) ×4 IMPLANT
SUT VIC AB 3-0 SH 27 (SUTURE) ×6
SUT VIC AB 3-0 SH 27X BRD (SUTURE) ×3 IMPLANT
TAPE UMBILICAL COTTON 1/8X30 (MISCELLANEOUS) ×2 IMPLANT
TOWEL GREEN STERILE (TOWEL DISPOSABLE) ×2 IMPLANT
TRAY FOLEY MTR SLVR 16FR STAT (SET/KITS/TRAYS/PACK) ×2 IMPLANT
UNDERPAD 30X36 HEAVY ABSORB (UNDERPADS AND DIAPERS) ×2 IMPLANT
WATER STERILE IRR 1000ML POUR (IV SOLUTION) ×2 IMPLANT

## 2020-07-28 NOTE — Progress Notes (Signed)
Mobility Specialist: Progress Note   07/28/20 1640  Mobility  Activity Ambulated in hall  Level of Assistance Modified independent, requires aide device or extra time  Assistive Device Front wheel walker  Distance Ambulated (ft) 370 ft  Mobility Response Tolerated well  Mobility performed by Mobility specialist  $Mobility charge 1 Mobility   Pre-Mobility: 90 HR, 134/83 BP, 96% SpO2 Post-Mobility: 73 HR, 147/85 BP, 96% SpO2  Pt c/o 8/10 incisional pain during ambulation. Pt otherwise asx. Pt back to bed after walk with family member present in room.   [name] Mobility Specialist Mobility Specialist Phone: [phone]

## 2020-07-28 NOTE — Transfer of Care (Signed)
Immediate Anesthesia Transfer of Care Note  Patient: Scott Duarte  Procedure(s) Performed: LEFT LOWER EXTREMITY BYPASS ABOVE KNEE POPITEAL ARTERY- BELOW KNEE POPLITEAL ARTERY GRAFT (Left Leg Lower) LEFT GREATER SAPHENOUS VEIN HARVEST (Left Leg Lower)  Patient Location: PACU  Anesthesia Type:General  Level of Consciousness: awake, alert  and oriented  Airway & Oxygen Therapy: Patient Spontanous Breathing and Patient connected to face mask oxygen  Post-op Assessment: Report given to RN, Post -op Vital signs reviewed and stable and Patient moving all extremities  Post vital signs: Reviewed and stable  Last Vitals:  Vitals Value Taken Time  BP 115/74 07/28/20 1150  Temp    Pulse 78 07/28/20 1156  Resp 20 07/28/20 1156  SpO2 99 % 07/28/20 1156  Vitals shown include unvalidated device data.  Last Pain:  Vitals:   07/28/20 0402  TempSrc: Oral  PainSc: 0-No pain         Complications: No complications documented.

## 2020-07-28 NOTE — Anesthesia Procedure Notes (Signed)
Procedure Name: Intubation Date/Time: 07/28/2020 9:02 AM Performed by: Colon Flattery, CRNA Pre-anesthesia Checklist: Patient identified, Emergency Drugs available, Suction available and Patient being monitored Patient Re-evaluated:Patient Re-evaluated prior to induction Oxygen Delivery Method: Circle system utilized Preoxygenation: Pre-oxygenation with 100% oxygen Induction Type: IV induction Ventilation: Mask ventilation without difficulty and Oral airway inserted - appropriate to patient size Tube type: Oral Tube size: 7.5 mm Number of attempts: 1 Airway Equipment and Method: Stylet and Oral airway Placement Confirmation: ETT inserted through vocal cords under direct vision,  positive ETCO2 and breath sounds checked- equal and bilateral Secured at: 22 cm Tube secured with: Tape Dental Injury: Teeth and Oropharynx as per pre-operative assessment

## 2020-07-28 NOTE — Progress Notes (Signed)
ANTICOAGULATION CONSULT NOTE  Pharmacy Consult for heparin Indication: DVT  No Known Allergies  Patient Measurements: Weight: 108 kg (238 lb) Heparin Dosing Weight: 95kg  Vital Signs: Temp: 97.5 F (36.4 C) (04/17 0402) Temp Source: Oral (04/17 0402) BP: 131/81 (04/17 0402) Pulse Rate: 80 (04/17 0402)  Labs: Recent Labs    07/27/20 1124 07/27/20 2202 07/28/20 0022 07/28/20 0518  HGB 15.9  --  15.3  --   HCT 45.9  --  44.2  --   PLT 248  --  239  --   HEPARINUNFRC  --  0.12*  --  0.17*  CREATININE 0.65  --  0.64  --     CrCl cannot be calculated (Unknown ideal weight.).  Assessment: 1 YOM presenting with leg pain, DVT r/o, not on anticoagulation PTA.  CBC wnl. Pt found to have partially thrombosed left popliteal artery aneurysms   Heparin level remains subtherapeutic (0.17) on gtt at 1800 units/hr. No issues with bleeding reported per RN. RN states that pump was beeping a few different times overnight requiring her to come into room and restart.  Goal of Therapy:  Heparin level 0.3-0.7 units/ml Monitor platelets by anticoagulation protocol: Yes   Plan:  Rebolus heparin 2000 units IV x 1, and increase gtt to 2100 units/hr F/u heparin level in 6 hours  Christoper Fabian, PharmD, BCPS Please see amion for complete clinical pharmacist phone list 07/28/2020 6:01 AM

## 2020-07-28 NOTE — Op Note (Signed)
Patient name: Scott Duarte MRN: [id number] DOB: 28-Jul-1970 Sex: male  07/28/2020 Pre-operative Diagnosis: Symptomatic left popliteal artery aneurysm Post-operative diagnosis:  Same Surgeon:  Apolinar Junes C. Randie Heinz, MD Assistant: Wendi Maya, PA Procedure Performed: 1.  Harvest left greater saphenous vein 2.  Left above-the-knee popliteal to below the knee popliteal artery bypass with reversed, ipsilateral, translocated greater saphenous vein in an end to end fashion above and below the knee  Indications: 50 year old male without previously known vascular disease presented with what appeared to be embolic disease from left popliteal artery aneurysm.  He was actually asymptomatic after presentation heparin was started.  He was subsequently indicated for bypass.  An assistant was necessary to expedite the case.  Findings: The artery was healthy above and below the knee.  His greater saphenous vein dilated up to approximately 5 mm.  The vein was placed in a reverse fashion and sewn end to end above the knee to the popliteal artery and end to end below the knee to the popliteal artery and the artery was ligated above and below the knee.   Procedure:  The patient was identified in the holding area and taken to the operating where he was placed supine on the operative table and general anesthesia was induced.  He was sterilely prepped and draped in the left lower extremity in the usual fashion, antibiotics were ministered and a timeout was called.  We began using ultrasound we identified saphenous vein above and below the knee and marked this on the skin.  We then made an incision to expose the above-knee popliteal artery.  We dissected down through skin subcutaneous tissue open the fascia.  We retracted the muscles anterior and posterior we identified the neurovascular bundle.  There was a very strong pulse in the popliteal artery a vessel loop was placed around this and we marked it for orientation.  Through  the same incision we identified the greater saphenous vein and dissected out for several centimeters.  We then made a below the knee incision.  We dissected through the skin subcutaneous tissue and entered the fascia.  Gastrocnemius was retracted laterally.  We did take down some of the soleus muscle off of the tibia.  We identified the popliteal vein and this was retracted medially.  A vessel loop was placed around the popliteal artery and we marked of the artery for orientation.  There was some difficulty in creating a tunnel between the above and below-knee incision we ultimately tunneled it just slightly laterally we able to get a tunnel and placed an umbilical tape there.  To the below-knee incision we identified the greater saphenous vein.  We dissected this out the entire to the incision dividing branches between clips and ties.  We dissected behind the knee into the above-knee incision.  A separate counterincision was made above the knee where we dissected out for several more centimeters and the vein was much larger above the knee.  We then ligated the vein most cephalad tied off with 2-0 silk suture and transected.  Distally we did the same.  The vein was then passed to the scrub tech.  We evaluated our below and below-knee arteries.  We then prepped the vein.  I elected that I had enough vein to reverse it used the largest segment.  We spatulated the distal end of the vein after transecting and using the healthiest piece.  This did dilate up to about 5 mm.  Patient was then administered 10,000 units of heparin.  We clamped the above-knee popliteal artery proximally and distally and transected it.  We tied it off with 2-0 silk distally.  We spatulated proximally.  We then sewed the vein in a reverse fashion end to end with 5-0 Prolene suture.  Upon completion we flushed through the vein there was very strong bleeding.  We marked the vein for orientation and doubly clipped it distally and tunneled it  through our previously performed tunnel.  We then clamped the below-knee popliteal artery distally and proximally.  We tied it off proximally.  We spatulated distally.  We straighten the leg and trimmed the vein to size.  The vein was then spatulated and sewn end to end to the below-knee popliteal artery with 5-0 Prolene suture.  We allowed flushing all directions upon completion.  We then opened our vein graft.  There is a palpable pulse at the ankle and the anterior tibial artery this was confirmed with Doppler.  We administered 50 mg of protamine.  We thoroughly irrigated the wounds and obtain hemostasis.  We closed in layers of Vicryl and Monocryl.  Dermabond was placed to the level of the skin.  He was then awakened from anesthesia having tolerated procedure well without immediate complication.  All counts were correct at completion.   EBL: 200cc  Makeba Delcastillo C. Randie Heinz, MD [facility] Office: [phone] Pager: [phone]

## 2020-07-28 NOTE — Progress Notes (Signed)
  Progress Note    07/28/2020 8:14 AM Day of Surgery  Subjective: No overnight issues, foot is feeling fine on the left Vitals:   07/28/20 0158 07/28/20 0402  BP: 121/77 131/81  Pulse: 92 80  Resp: 14 18  Temp: 97.8 F (36.6 C) (!) 97.5 F (36.4 C)  SpO2: 98% 100%    Physical Exam: Awake alert oriented On the respirations Abdomen is soft Palpable common femoral pulses bilaterally Wide left popliteal pulse 2+ dorsalis pedis pulse on the left Sluggish capillary refill left great toe rest of the foot is warm and well-perfused, he has no tenderness to palpation of his left foot and it is fully sensorimotor intact  CBC    Component Value Date/Time   WBC 9.2 07/28/2020 0022   RBC 4.91 07/28/2020 0022   HGB 15.3 07/28/2020 0022   HCT 44.2 07/28/2020 0022   PLT 239 07/28/2020 0022   MCV 90.0 07/28/2020 0022   MCV 94.8 04/23/2013 0858   MCH 31.2 07/28/2020 0022   MCHC 34.6 07/28/2020 0022   RDW 13.3 07/28/2020 0022   LYMPHSABS 2.3 07/27/2020 1124   MONOABS 0.7 07/27/2020 1124   EOSABS 0.1 07/27/2020 1124   BASOSABS 0.1 07/27/2020 1124    BMET    Component Value Date/Time   NA 136 07/28/2020 0022   K 3.4 (L) 07/28/2020 0022   CL 104 07/28/2020 0022   CO2 23 07/28/2020 0022   GLUCOSE 183 (H) 07/28/2020 0022   BUN 7 07/28/2020 0022   CREATININE 0.64 07/28/2020 0022   CREATININE 0.83 04/23/2013 0840   CALCIUM 9.2 07/28/2020 0022   GFRNONAA >60 07/28/2020 0022    INR No results found for: INR   Intake/Output Summary (Last 24 hours) at 07/28/2020 0814 Last data filed at 07/28/2020 0641 Gross per 24 hour  Intake 1447.97 ml  Output 900 ml  Net 547.97 ml     Assessment/plan:  50 y.o. male is admitted with symptomatic left popliteal artery aneurysm.  He does not appear to have any symptoms this morning from what appears to have been debris embolus from his aneurysm.  He does not appear to have any compressive symptoms from the popliteal aneurysm.  We will plan  bypass from above the knee to below the knee, appears to have suitable vein.  We will ligate artery proximally and distally to prevent future embolic phenomena.  I discussed the risk and benefits with the patient he demonstrates good understanding.  His wife is available by telephone.     Zai Chmiel C. Randie Heinz, MD [facility] Office: [phone] Pager: [phone]  07/28/2020 8:14 AM

## 2020-07-28 NOTE — Progress Notes (Addendum)
ANTICOAGULATION CONSULT NOTE  Pharmacy Consult for heparin Indication: DVT  No Known Allergies  Patient Measurements: Height: 5\' 8"  (172.7 cm) Weight: 108 kg (238 lb) IBW/kg (Calculated) : 68.4 Heparin Dosing Weight: 95kg  Vital Signs: Temp: 98 F (36.7 C) (04/17 1230) Temp Source: Oral (04/17 0402) BP: 111/73 (04/17 1240) Pulse Rate: 69 (04/17 1240)  Labs: Recent Labs    07/27/20 1124 07/27/20 2202 07/28/20 0022 07/28/20 0518  HGB 15.9  --  15.3  --   HCT 45.9  --  44.2  --   PLT 248  --  239  --   HEPARINUNFRC  --  0.12*  --  0.17*  CREATININE 0.65  --  0.64  --     Estimated Creatinine Clearance: 128.6 mL/min (by C-G formula based on SCr of 0.64 mg/dL).  Assessment: 8 YOM presenting with leg pain, DVT r/o, not on anticoagulation PTA.  CBC wnl. Pt found to have partially thrombosed left popliteal artery aneurysms   Heparin level previously subtherapeutic (0.17) on gtt at 1800 units/hr. Heparin was rebolused 2000 units x1, followed by inc to 2100 units/hr at 0600. Heparin was stopped at 0815 prior to popliteal bypass surgery. Pharmacy has been consulted to resume heparin at 1800 post surgery.   Goal of Therapy:  Heparin level 0.3-0.7 units/ml Monitor platelets by anticoagulation protocol: Yes   Plan:  Resume heparin 2100 units/hr at 1800  Heparin level 6 hours after restart Daily heparin level, CBC   Addendum: Per discussion with [facility]VS PA, do not resume heparin post op.   44, PharmD PGY1 Acute Care Pharmacy Resident 07/28/2020 12:48 PM  Please check [email or web address] for unit specific pharmacy phone numbers.

## 2020-07-28 NOTE — Anesthesia Postprocedure Evaluation (Signed)
Anesthesia Post Note  Patient: Scott Duarte  Procedure(s) Performed: LEFT LOWER EXTREMITY BYPASS ABOVE KNEE POPITEAL ARTERY- BELOW KNEE POPLITEAL ARTERY GRAFT (Left Leg Lower) LEFT GREATER SAPHENOUS VEIN HARVEST (Left Leg Lower)     Patient location during evaluation: PACU Anesthesia Type: General Level of consciousness: awake Pain management: pain level controlled Vital Signs Assessment: post-procedure vital signs reviewed and stable Cardiovascular status: stable Postop Assessment: no apparent nausea or vomiting Anesthetic complications: no   No complications documented.  Last Vitals:  Vitals:   07/28/20 1220 07/28/20 1230  BP: 119/73 121/77  Pulse: 74 67  Resp: 16 13  Temp:    SpO2: 97% 98%    Last Pain:  Vitals:   07/28/20 1210  TempSrc:   PainSc: 6                  Kaiser Belluomini

## 2020-07-28 NOTE — H&P (Signed)
Hospital Consult    Reason for Consult:  Left popliteal artery aneursym Referring Physician:  Dr. Madilyn Hook MRN #:  [id number]  History of Present Illness: This is a 50 y.o. male without previous medical history states that for 1 week he has had left calf and foot pain.  He was working Tuesday when he noticed the pain initially.  This morning he noticed discoloration of his toes he was evaluated in urgent care sent to the emergency department for further evaluation.  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  He has now been started on heparin and CT angio ordered.  Patient states that he is currently not having any pain.  The discoloration is not associated with any numbness or tingling and he has full motor intact of his left leg.  He is on heparin now but does not take any blood thinners.  He denies any history of stroke, TIA or amaurosis.  He does not know of any personal or family history of aneurysm disease.  History reviewed. No pertinent past medical history.  History reviewed. No pertinent surgical history.  No Known Allergies  Prior to Admission medications   Medication Sig Start Date End Date Taking? Authorizing Provider  aspirin 81 MG tablet Take 81 mg by mouth daily. Reported on 04/18/2015    [provider]  atorvastatin (LIPITOR) 20 MG tablet Take 1 tablet (20 mg total) by mouth daily. Patient not taking: Reported on 04/18/2015 04/25/13   Elvina Sidle, MD  fenofibrate (TRICOR) 145 MG tablet Take 145 mg by mouth daily.    [provider]  metFORMIN (GLUMETZA) 500 MG (MOD) 24 hr tablet Take 500 mg by mouth daily with breakfast.    [provider]  methylPREDNIsolone (MEDROL DOSPACK) 4 MG tablet follow package directions Patient not taking: Reported on 04/18/2015 04/23/13   Elvina Sidle, MD  traMADol (ULTRAM) 50 MG tablet Take 1 tablet (50 mg total) by mouth every 8 (eight) hours  as needed. 04/18/15   Carmelina Dane, MD    Social History   Socioeconomic History  . Marital status: Married    Spouse name: Not on file  . Number of children: Not on file  . Years of education: Not on file  . Highest education level: Not on file  Occupational History  . Not on file  Tobacco Use  . Smoking status: Former Games developer  . Smokeless tobacco: Never Used  Substance and Sexual Activity  . Alcohol use: Not on file  . Drug use: Not on file  . Sexual activity: Not on file  Other Topics Concern  . Not on file  Social History Narrative  . Not on file   Social Determinants of Health   Financial Resource Strain: Not on file  Food Insecurity: Not on file  Transportation Needs: Not on file  Physical Activity: Not on file  Stress: Not on file  Social Connections: Not on file  Intimate Partner Violence: Not on file     Family History  Problem Relation Age of Onset  . Diabetes Mother   . Diabetes Maternal Grandfather   . Diabetes Paternal Grandmother   . Heart disease Paternal Grandfather     ROS: Cardiovascular: []  chest pain/pressure []  palpitations []  SOB lying flat []  DOE []  pain in legs while walking []  pain in legs at rest []  pain in legs at night []  non-healing ulcers []  hx of DVT [x]  swelling  in legs  Pulmonary: []  productive cough []  asthma/wheezing []  home O2  Neurologic: []  weakness in []  arms []  legs []  numbness in []  arms []  legs []  hx of CVA []  mini stroke [] difficulty speaking or slurred speech []  temporary loss of vision in one eye []  dizziness  Hematologic: []  hx of cancer []  bleeding problems []  problems with blood clotting easily  Endocrine:   []  diabetes []  thyroid disease  GI []  vomiting blood []  blood in stool  GU: []  CKD/renal failure []  HD--[]  M/W/F or []  T/T/S []  burning with urination []  blood in urine  Psychiatric: []  anxiety []  depression  Musculoskeletal: []  arthritis []  joint  pain  Integumentary: []  rashes [x]  discoloration left toes  Constitutional: []  fever []  chills   Physical Examination  Vitals:   07/28/20 0158 07/28/20 0402  BP: 121/77 131/81  Pulse: 92 80  Resp: 14 18  Temp: 97.8 F (36.6 C) (!) 97.5 F (36.4 C)  SpO2: 98% 100%   Body mass index is 34.15 kg/m.  General:  nad HENT: WNL, normocephalic Pulmonary: normal non-labored breathing Cardiac: Palpable radial, common femoral pulses bilaterally Strong right popliteal and dorsalis pedis pulses Left popliteal pulse is 2+ and widened, 2+ palpable left dorsalis pedis pulse with 1+ posterior tibial pulse Abdomen:  soft, NT/ND, no masses Extremities: Blotchy discoloration right toes mostly affecting the right great toe with sluggish capillary refill of the great toe but the foot is warm and well-perfused Neurologic: A&O X 3; Appropriate Affect ; no sensory or motor dysfunction of either foot  CBC    Component Value Date/Time   WBC 9.2 07/28/2020 0022   RBC 4.91 07/28/2020 0022   HGB 15.3 07/28/2020 0022   HCT 44.2 07/28/2020 0022   PLT 239 07/28/2020 0022   MCV 90.0 07/28/2020 0022   MCV 94.8 04/23/2013 0858   MCH 31.2 07/28/2020 0022   MCHC 34.6 07/28/2020 0022   RDW 13.3 07/28/2020 0022   LYMPHSABS 2.3 07/27/2020 1124   MONOABS 0.7 07/27/2020 1124   EOSABS 0.1 07/27/2020 1124   BASOSABS 0.1 07/27/2020 1124    BMET    Component Value Date/Time   NA 136 07/28/2020 0022   K 3.4 (L) 07/28/2020 0022   CL 104 07/28/2020 0022   CO2 23 07/28/2020 0022   GLUCOSE 183 (H) 07/28/2020 0022   BUN 7 07/28/2020 0022   CREATININE 0.64 07/28/2020 0022   CREATININE 0.83 04/23/2013 0840   CALCIUM 9.2 07/28/2020 0022   GFRNONAA >60 07/28/2020 0022    COAGS: No results found for: INR, PROTIME   Non-Invasive Vascular Imaging:   +-----+---------------+---------+-----------+----------+--------------+  RIGHTCompressibilityPhasicitySpontaneityPropertiesThrombus Aging   +-----+---------------+---------+-----------+----------+--------------+  CFV Full      Yes   Yes                  +-----+---------------+---------+-----------+----------+--------------+         +---------+---------------+---------+-----------+----------+---------------  ----+  LEFT   CompressibilityPhasicitySpontaneityPropertiesThrombus Aging     +---------+---------------+---------+-----------+----------+---------------  ----+  CFV   Full      Yes   Yes                     +---------+---------------+---------+-----------+----------+---------------  ----+  SFJ   Full                                  +---------+---------------+---------+-----------+----------+---------------  ----+  FV Prox Full                                  +---------+---------------+---------+-----------+----------+---------------  ----+  FV Mid  Full                                  +---------+---------------+---------+-----------+----------+---------------  ----+  FV DistalFull                                  +---------+---------------+---------+-----------+----------+---------------  ----+  PFV   Full                                  +---------+---------------+---------+-----------+----------+---------------  ----+  POP   Full      Yes   Yes                     +---------+---------------+---------+-----------+----------+---------------  ----+  PTV                           Not well  visualized  +---------+---------------+---------+-----------+----------+---------------  ----+  PERO                           Not well  visualized   +---------+---------------+---------+-----------+----------+---------------  ----+      Summary:  RIGHT:  - No evidence of common femoral vein obstruction.    LEFT:  - There is no evidence of deep vein thrombosis in the lower extremity.  However, portions of this examination were limited- see technologist  comments above.    - Incidentally, there is popliteal artery aneurysm noted. Cannot rule out  thrombotic material.    CT angio IMPRESSION: VASCULAR  1. 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 2. 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   ASSESSMENT/PLAN: This is a 50 y.o. male here with partially thrombosed left popliteal artery aneurysms which appears to be showering clot distally in his foot.  He is now on heparin he denies any pain does have palpable distal pulses.  I have recommended given his young age that he needs bypass from his SFA or above-knee popliteal artery to his below-knee popliteal artery.  I have offered alternatives of stenting and noninterventional therapy which would include anticoagulation but given his current symptomatic aneurysm I think would be with high risk of continued symptoms and possible amputation.  I discussed the risk and benefits of surgery which would be possible injury to surrounding structures, future thrombosis of the bypass graft that would threaten his limb, possible requirement of blood transfusion and risk to his heart and lungs during surgery.  He demonstrates good understanding.  He will be n.p.o. past midnight we will plan for left lower extremity bypass tomorrow.  Vein mapping has been ordered.  Continue heparin drip until the time of surgery.  Slate Debroux C. Randie Heinz, MD [facility] Office: [phone] Pager: [phone]

## 2020-07-28 NOTE — Anesthesia Preprocedure Evaluation (Addendum)
Anesthesia Evaluation  Patient identified by MRN, date of birth, ID band Patient awake    Reviewed: Allergy & Precautions, NPO status , Patient's Chart, lab work & pertinent test results  Airway Mallampati: II  TM Distance: >3 FB     Dental   Pulmonary former smoker,    breath sounds clear to auscultation       Cardiovascular + Peripheral Vascular Disease   Rhythm:Regular Rate:Normal  History noted. CG   Neuro/Psych    GI/Hepatic Neg liver ROS,   Endo/Other  diabetes  Renal/GU negative Renal ROS     Musculoskeletal   Abdominal   Peds  Hematology   Anesthesia Other Findings   Reproductive/Obstetrics                            Anesthesia Physical Anesthesia Plan  ASA: III  Anesthesia Plan: General   Post-op Pain Management:    Induction: Intravenous  PONV Risk Score and Plan: 3 and Ondansetron, Dexamethasone and Midazolam  Airway Management Planned: Oral ETT  Additional Equipment:   Intra-op Plan:   Post-operative Plan: Extubation in OR  Informed Consent: I have reviewed the patients History and Physical, chart, labs and discussed the procedure including the risks, benefits and alternatives for the proposed anesthesia with the patient or authorized representative who has indicated his/her understanding and acceptance.     Dental advisory given  Plan Discussed with: CRNA and Anesthesiologist  Anesthesia Plan Comments:         Anesthesia Quick Evaluation

## 2020-07-29 ENCOUNTER — Encounter (HOSPITAL_COMMUNITY): Payer: Self-pay | Admitting: Vascular Surgery

## 2020-07-29 ENCOUNTER — Encounter: Payer: Self-pay | Admitting: Vascular Surgery

## 2020-07-29 LAB — BASIC METABOLIC PANEL
Anion gap: 9 (ref 5–15)
BUN: 12 mg/dL (ref 6–20)
CO2: 23 mmol/L (ref 22–32)
Calcium: 8.7 mg/dL — ABNORMAL LOW (ref 8.9–10.3)
Chloride: 104 mmol/L (ref 98–111)
Creatinine, Ser: 0.84 mg/dL (ref 0.61–1.24)
GFR, Estimated: >60 mL/min (ref >60)
Glucose, Bld: 177 mg/dL — ABNORMAL HIGH (ref 70–99)
Potassium: 3.9 mmol/L (ref 3.5–5.1)
Sodium: 136 mmol/L (ref 135–145)

## 2020-07-29 LAB — HEMOGLOBIN A1C
Hgb A1c MFr Bld: 9.6 % — ABNORMAL HIGH (ref 4.8–5.6)
Mean Plasma Glucose: 229 mg/dL

## 2020-07-29 LAB — CBC
HCT: 40.1 % (ref 39.0–52.0)
Hemoglobin: 13.5 g/dL (ref 13.0–17.0)
MCH: 30.9 pg (ref 26.0–34.0)
MCHC: 33.7 g/dL (ref 30.0–36.0)
MCV: 91.8 fL (ref 80.0–100.0)
Platelets: 218 10*3/uL (ref 150–400)
RBC: 4.37 MIL/uL (ref 4.22–5.81)
RDW: 13.5 % (ref 11.5–15.5)
WBC: 13.4 10*3/uL — ABNORMAL HIGH (ref 4.0–10.5)
nRBC: 0 % (ref 0.0–0.2)

## 2020-07-29 LAB — LIPID PANEL
Cholesterol: 164 mg/dL (ref 0–200)
HDL: 27 mg/dL — ABNORMAL LOW (ref >40)
LDL Cholesterol: 82 mg/dL (ref 0–99)
Total CHOL/HDL Ratio: 6.1 RATIO
Triglycerides: 277 mg/dL — ABNORMAL HIGH (ref <150)
VLDL: 55 mg/dL — ABNORMAL HIGH (ref 0–40)

## 2020-07-29 LAB — GLUCOSE, CAPILLARY
Glucose-Capillary: 166 mg/dL — ABNORMAL HIGH (ref 70–99)
Glucose-Capillary: 169 mg/dL — ABNORMAL HIGH (ref 70–99)

## 2020-07-29 MED ORDER — OXYCODONE-ACETAMINOPHEN 5-325 MG PO TABS: 1.0000 | ORAL_TABLET | Freq: Four times a day (QID) | ORAL | 0 refills | Status: DC | PRN

## 2020-07-29 MED ORDER — HEPARIN SODIUM (PORCINE) 5000 UNIT/ML IJ SOLN: 5000.0000 [IU] | Freq: Three times a day (TID) | INTRAMUSCULAR | Status: DC

## 2020-07-29 NOTE — Progress Notes (Signed)
Foley discontinued. Patient due to void at 12.

## 2020-07-29 NOTE — Discharge Summary (Signed)
Discharge Summary     Scott NailerLuis Duarte Aug 03, 1970 50 y.o. male  [id number]  Admission Date: 07/27/2020  Discharge Date: 07/29/2020  Physician: Juventino Slovakain, Brandon Christophe*  Admission Diagnosis: Popliteal artery aneurysm [name](HCC) [I72.4]  HPI:   This is a 50 y.o. male without previous medical history states that for 1 week he has had left calf and foot pain.  He was working Tuesday when he noticed the pain initially.  This morning he noticed discoloration of his toes he was evaluated in urgent care sent to the emergency department for further evaluation.  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  He has now been started on heparin and CT angio ordered.  Patient states that he is currently not having any pain.  The discoloration is not associated with any numbness or tingling and he has full motor intact of his left leg.  He is on heparin now but does not take any blood thinners.  He denies any history of stroke, TIA or amaurosis.  He does not know of any personal or family history of aneurysm disease.  History reviewed. No pertinent past medical history.  History reviewed. No pertinent surgical history.  No Known Allergies  Hospital Course:  The patient was admitted to the hospital and taken to the operating room on 07/28/2020 and underwent: 1.  Harvest left greater saphenous vein 2.  Left above-the-knee popliteal to below the knee popliteal artery bypass with reversed, ipsilateral, translocated greater saphenous vein in an end to end fashion above and below the knee    Findings: The artery was healthy above and below the knee.  His greater saphenous vein dilated up to approximately 5 mm.  The vein was placed in a reverse fashion and sewn end to end above the knee to the popliteal artery and end to end below the knee to the popliteal artery and the artery was ligated above and below the knee.  The pt tolerated the  procedure well and was transported to the PACU in good condition.   By POD 1, pt was doing well with palpable DP pulse.  He had been started on asa.  He is on statin.  He has ambulated and okay for discharge.  PDMP reviewed.    CBC    Component Value Date/Time   WBC 13.4 (H) 07/29/2020 0202   RBC 4.37 07/29/2020 0202   HGB 13.5 07/29/2020 0202   HCT 40.1 07/29/2020 0202   PLT 218 07/29/2020 0202   MCV 91.8 07/29/2020 0202   MCV 94.8 04/23/2013 0858   MCH 30.9 07/29/2020 0202   MCHC 33.7 07/29/2020 0202   RDW 13.5 07/29/2020 0202   LYMPHSABS 2.3 07/27/2020 1124   MONOABS 0.7 07/27/2020 1124   EOSABS 0.1 07/27/2020 1124   BASOSABS 0.1 07/27/2020 1124    BMET    Component Value Date/Time   NA 136 07/29/2020 0202   K 3.9 07/29/2020 0202   CL 104 07/29/2020 0202   CO2 23 07/29/2020 0202   GLUCOSE 177 (H) 07/29/2020 0202   BUN 12 07/29/2020 0202   CREATININE 0.84 07/29/2020 0202   CREATININE 0.83 04/23/2013 0840   CALCIUM 8.7 (L) 07/29/2020 0202   GFRNONAA >60 07/29/2020 0202     Discharge Instructions    Discharge patient   Complete by: As directed    Discharge disposition: 01-Home or Self Care   Discharge patient date: 07/29/2020      Discharge Diagnosis:  Popliteal artery aneurysm (HCC) [I72.4]  Secondary Diagnosis: Patient Active Problem List   Diagnosis Date Noted  . 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 Tobacco use disorder 04/18/2015   Past Medical History:  Diagnosis Date  . Diabetes mellitus without complication (HCC)   . Hyperlipidemia      Allergies as of 07/29/2020   No Known Allergies     Medication List    STOP taking these medications   traMADol 50 MG tablet Commonly known as: ULTRAM     TAKE these medications   aspirin 81 MG tablet Take 81 mg by mouth daily. Reported on 04/18/2015   atorvastatin 20 MG tablet Commonly known as: LIPITOR Take 1 tablet (20 mg total) by mouth daily.   meloxicam 15 MG tablet Commonly known  as: MOBIC Take 1 tablet by mouth daily.   metFORMIN 500 MG (MOD) 24 hr tablet Commonly known as: GLUMETZA Take 500 mg by mouth daily with breakfast.   methylPREDNIsolone 4 MG tablet Commonly known as: MEDROL DOSPACK follow package directions   oxyCODONE-acetaminophen 5-325 MG tablet Commonly known as: Percocet Take 1 tablet by mouth every 6 (six) hours as needed.       Discharge Instructions: [facility] Discharge instructions Lower Extremity Bypass Surgery  Please refer to the following instruction for your post-procedure care. Your surgeon or physician assistant will discuss any changes with you.  Activity  You are encouraged to walk as much as you can. You can slowly return to normal activities during the month after your surgery. Avoid strenuous activity and heavy lifting until your doctor tells you it's OK. Avoid activities such as vacuuming or swinging a golf club. Do not drive until your doctor give the OK and you are no longer taking prescription pain medications. It is also normal to have difficulty with sleep habits, eating and bowel movement after surgery. These will go away with time.  Bathing/Showering  You may shower after you go home. Do not soak in a bathtub, hot tub, or swim until the incision heals completely.  Incision Care  Clean your incision with mild soap and water. Shower every day. Pat the area dry with a clean towel. You do not need a bandage unless otherwise instructed. Do not apply any ointments or creams to your incision. If you have open wounds you will be instructed how to care for them or a visiting nurse may be arranged for you. If you have staples or sutures along your incision they will be removed at your post-op appointment. You may have skin glue on your incision. Do not peel it off. It will come off on its own in about one week.  Wash the groin wound with soap and water daily and pat dry. (No tub bath-only  shower)  Then put a dry gauze or washcloth in the groin to keep this area dry to help prevent wound infection.  Do this daily and as needed.  Do not use Vaseline or neosporin on your incisions.  Only use soap and water on your incisions and then protect and keep dry.  Diet  Resume your normal diet. There are no special food restrictions following this procedure. A low fat/ low cholesterol diet is recommended for all patients with vascular disease. In order to heal from your surgery, it is CRITICAL to get adequate nutrition. Your body requires vitamins, minerals, and protein. Vegetables are the best source of vitamins and minerals. Vegetables also provide the perfect balance of  protein. Processed food has little nutritional value, so try to avoid this.  Medications  Resume taking all your medications unless your doctor or Physician Assistant tells you not to. If your incision is causing pain, you may take over-the-counter pain relievers such as acetaminophen (Tylenol). If you were prescribed a stronger pain medication, please aware these medication can cause nausea and constipation. Prevent nausea by taking the medication with a snack or meal. Avoid constipation by drinking plenty of fluids and eating foods with high amount of fiber, such as fruits, vegetables, and grains. Take Colace 100 mg (an over-the-counter stool softener) twice a day as needed for constipation.  Do not take Tylenol if you are taking prescription pain medications.  Follow Up  Our office will schedule a follow up appointment 2-3 weeks following discharge.  Please call us immediately for any of the following conditions  .Severe or worsening pain in your legs or feet while at rest or while walking .Increase pain, redness, warmth, or drainage (pus) from your incision site(s) . Fever of 101 degree or higher . The swelling in your leg with the bypass suddenly worsens and becomes more painful than when you were in the hospital . If  you have been instructed to feel your graft pulse then you should do so every day. If you can no longer feel this pulse, call the office immediately. Not all patients are given this instruction. .  Leg swelling is common after leg bypass surgery.  The swelling should improve over a few months following surgery. To improve the swelling, you may elevate your legs above the level of your heart while you are sitting or resting. Your surgeon or physician assistant may ask you to apply an ACE wrap or wear compression (TED) stockings to help to reduce swelling.  Reduce your risk of vascular disease  Stop smoking. If you would like help call [facility] at 1-800-QUIT-NOW (727-359-7831) or [facility] at 215-123-3466.  . Manage your cholesterol . Maintain a desired weight . Control your diabetes weight . Control your diabetes . Keep your blood pressure down .  If you have any questions, please call the office at [phone]   Prescriptions given: 1.  Roxicet #20 No Refill (PDMP reviewed)  Disposition: home  Patient's condition: is Good  Follow up: 1.  [facility] in 3-4 weeks   Doreatha Massed, PA-C [facility] [phone] 07/29/2020  10:01 AM  - For VQI Registry use ---   Post-op:  Wound infection: No  Graft infection: No  Transfusion: No    If yes, n/a units given New Arrhythmia: No Ipsilateral amputation: No, [ ]  Minor, [ ]  BKA, [ ]  AKA Discharge patency: [x ] Primary, [ ]  Primary assisted, [ ]  Secondary, [ ]  Occluded Patency judged by: [ ]  Dopper only, [ ]  Palpable graft pulse, [x]  Palpable distal pulse, [ ]  ABI inc. > 0.15, [ ]  Duplex Discharge ABI: R not done, L  D/C Ambulatory Status: Ambulatory  Complications: MI: No, [ ]  Troponin only, [ ]  EKG or Clinical CHF: No Resp failure:No, [ ]  Pneumonia, [ ]  Ventilator Chg in renal function: No, [ ]  Inc. Cr > 0.5, [ ]  Temp. Dialysis,  [ ]  Permanent dialysis Stroke: No, [ ]  Minor, [ ]  Major Return to OR:  No  Reason for return to OR: [ ]  Bleeding, [ ]  Infection, [ ]  Thrombosis, [ ]  Revision  Discharge medications: Statin use:  yes ASA use:  yes Plavix use:  no Beta blocker  use: no CCB use:  No ACEI use:   no ARB use:  no Coumadin use: no

## 2020-07-29 NOTE — Evaluation (Signed)
Physical Therapy Evaluation Patient Details Name: Scott Duarte MRN: [id number] DOB: 1970/12/19 Today's Date: 07/29/2020   History of Present Illness  50 yo male presenting with left calf and foot pain. 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 Started on heparin and CT angio ordered to confirm L popliteal artery aneurysm. PMH including tobacca use.  Clinical Impression  Pt is close to baseline functioning and should be safe at home with available family assist. There are no further acute PT needs.  Will sign off at this time.     Follow Up Recommendations No PT follow up;Supervision - Intermittent    Equipment Recommendations  None recommended by PT    Recommendations for Other Services       Precautions / Restrictions Precautions Precautions: None      Mobility  Bed Mobility Overal bed mobility: Modified Independent             General bed mobility comments: increased time No physical A needed    Transfers Overall transfer level: Needs assistance Equipment used: None Transfers: Sit to/from Stand Sit to Stand: Supervision         General transfer comment: Supervision for safety. no physical A needed  Ambulation/Gait Ambulation/Gait assistance: Supervision Gait Distance (Feet): 200 Feet Assistive device: None;Rolling walker (2 wheeled) Gait Pattern/deviations: Step-through pattern   Gait velocity interpretation: <1.8 ft/sec, indicate of risk for recurrent falls General Gait Details: mildly antalgic and steady with/without AD  Stairs Stairs: Yes Stairs assistance: Supervision Stair Management: One rail Left;Step to pattern;Forwards Number of Stairs: 6 General stair comments: cues for step to pattern ascending/descending and pt is safe with use of the rail  Wheelchair Mobility    Modified Rankin (Stroke Patients Only)       Balance Overall balance assessment: Mild deficits observed, not formally tested (USe of RW  for comfort)                                           Pertinent Vitals/Pain Pain Assessment: Faces Faces Pain Scale: Hurts a little bit Pain Location: LLE Pain Descriptors / Indicators: Discomfort    Home Living Family/patient expects to be discharged to:: Private residence Living Arrangements: Spouse/significant other;Children Available Help at Discharge: Family;Available 24 hours/day Type of Home: House Home Access: Level entry     Home Layout: One level Home Equipment: None      Prior Function Level of Independence: Independent         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     Hand Dominance   Dominant Hand: Right    Extremity/Trunk Assessment   Upper Extremity Assessment Upper Extremity Assessment: Overall WFL for tasks assessed    Lower Extremity Assessment Lower Extremity Assessment: Overall WFL for tasks assessed LLE Deficits / Details: pain at LLE LLE Coordination: decreased gross motor    Cervical / Trunk Assessment Cervical / Trunk Assessment: Normal  Communication   Communication: No difficulties;Other (comment) (First language spanish; speaks english)  Cognition Arousal/Alertness: Awake/alert Behavior During Therapy: WFL for tasks assessed/performed Overall Cognitive Status: Within Functional Limits for tasks assessed                                        General Comments General comments (skin integrity, edema,  etc.): vss on RA through out    Exercises     Assessment/Plan    PT Assessment Patent does not need any further PT services  PT Problem List         PT Treatment Interventions      PT Goals (Current goals can be found in the Care Plan section)  Acute Rehab PT Goals Patient Stated Goal: Go home today PT Goal Formulation: All assessment and education complete, DC therapy    Frequency     Barriers to discharge        Co-evaluation               AM-PAC PT "6  Clicks" Mobility  Outcome Measure Help needed turning from your back to your side while in a flat bed without using bedrails?: None Help needed moving from lying on your back to sitting on the side of a flat bed without using bedrails?: None Help needed moving to and from a bed to a chair (including a wheelchair)?: A Little Help needed standing up from a chair using your arms (e.g., wheelchair or bedside chair)?: A Little Help needed to walk in hospital room?: A Little Help needed climbing 3-5 steps with a railing? : A Little 6 Click Score: 20    End of Session     Patient left: in bed;with call bell/phone within reach Nurse Communication: Mobility status PT Visit Diagnosis: Other abnormalities of gait and mobility (R26.89);Pain Pain - Right/Left: Left Pain - part of body: Leg    Time: 1041-1100 PT Time Calculation (min) (ACUTE ONLY): 19 min   Charges:   PT Evaluation $PT Eval Low Complexity: 1 Low          07/29/2020  Jacinto Halim., PT Acute Rehabilitation Services [phone]  (pager) [phone]  (office)  Eliseo Gum Irelynd Zumstein 07/29/2020, 12:53 PM

## 2020-07-29 NOTE — Progress Notes (Addendum)
  Progress Note    07/29/2020 7:04 AM 1 Day Post-Op  Subjective:  He denies any pain.  Has questions about after discharge  Tm 99.7 now afebrile HR 60's-80's NSR 120's systolic  100%   Vitals:   07/29/20 0100 07/29/20 0538  BP: 125/68 127/69  Pulse:  77  Resp:  15  Temp: 98.4 F (36.9 C) 98.7 F (37.1 C)  SpO2:  100%    Physical Exam: Cardiac:  regular Lungs:  Non labored Incisions:  All incisions look good Extremities:  Easily palpable left DP pulse; mild swelling LLE   CBC    Component Value Date/Time   WBC 13.4 (H) 07/29/2020 0202   RBC 4.37 07/29/2020 0202   HGB 13.5 07/29/2020 0202   HCT 40.1 07/29/2020 0202   PLT 218 07/29/2020 0202   MCV 91.8 07/29/2020 0202   MCV 94.8 04/23/2013 0858   MCH 30.9 07/29/2020 0202   MCHC 33.7 07/29/2020 0202   RDW 13.5 07/29/2020 0202   LYMPHSABS 2.3 07/27/2020 1124   MONOABS 0.7 07/27/2020 1124   EOSABS 0.1 07/27/2020 1124   BASOSABS 0.1 07/27/2020 1124    BMET    Component Value Date/Time   NA 136 07/29/2020 0202   K 3.9 07/29/2020 0202   CL 104 07/29/2020 0202   CO2 23 07/29/2020 0202   GLUCOSE 177 (H) 07/29/2020 0202   BUN 12 07/29/2020 0202   CREATININE 0.84 07/29/2020 0202   CREATININE 0.83 04/23/2013 0840   CALCIUM 8.7 (L) 07/29/2020 0202   GFRNONAA >60 07/29/2020 0202    INR No results found for: INR   Intake/Output Summary (Last 24 hours) at 07/29/2020 0704 Last data filed at 07/29/2020 0500 Gross per 24 hour  Intake 1400 ml  Output 3750 ml  Net -2350 ml     Assessment:  50 y.o. male is s/p:  Left above-the-knee popliteal to below the knee popliteal artery bypass with reversed, ipsilateral, translocated greater saphenous vein in an end to end fashion above and below the knee  1 Day Post-Op   Plan: -easily palpable DP pulse on the left -motor and sensory are in tact.  Left calf and anterior compartments are soft and non tender -mobilized yesterday.   -pt on baby asa-sq heparin ordered  to start this afternoon.  -pt may discharge - will d/w MD   Doreatha Massed, PA-C [facility] [phone] 07/29/2020 7:04 AM  VASCULAR STAFF ADDENDUM: I have independently interviewed and examined the patient. I agree with the above.  Routine discharge precautions reviewed with patient. OK for discharge today.  Rande Brunt. Lenell Antu, MD [facility] Phone Number: [phone] 07/29/2020 7:54 AM

## 2020-07-29 NOTE — Discharge Instructions (Signed)
 [facility] [place]  Discharge instructions  Lower Extremity Bypass Surgery  Please refer to the following instruction for your post-procedure care. Your surgeon or physician assistant will discuss any changes with you.  Activity  You are encouraged to walk as much as you can. You can slowly return to normal activities during the month after your surgery. Avoid strenuous activity and heavy lifting until your doctor tells you it's OK. Avoid activities such as vacuuming or swinging a golf club. Do not drive until your doctor give the OK and you are no longer taking prescription pain medications. It is also normal to have difficulty with sleep habits, eating and bowel movement after surgery. These will go away with time.  Bathing/Showering  Shower daily after you go home. Do not soak in a bathtub, hot tub, or swim until the incision heals completely.  Incision Care  Clean your incision with mild soap and water. Shower every day. Pat the area dry with a clean towel. You do not need a bandage unless otherwise instructed. Do not apply any ointments or creams to your incision. If you have open wounds you will be instructed how to care for them or a visiting nurse may be arranged for you. If you have staples or sutures along your incision they will be removed at your post-op appointment. You may have skin glue on your incision. Do not peel it off. It will come off on its own in about one week.  Wash the groin wound with soap and water daily and pat dry. (No tub bath-only shower)  Then put a dry gauze or washcloth in the groin to keep this area dry to help prevent wound infection.  Do this daily and as needed.  Do not use Vaseline or neosporin on your incisions.  Only use soap and water on your incisions and then protect and keep dry.  Diet  Resume your normal diet. There are no special food restrictions following this procedure. A low fat/ low cholesterol diet is  recommended for all patients with vascular disease. In order to heal from your surgery, it is CRITICAL to get adequate nutrition. Your body requires vitamins, minerals, and protein. Vegetables are the best source of vitamins and minerals. Vegetables also provide the perfect balance of protein. Processed food has little nutritional value, so try to avoid this.  Medications  Resume taking all your medications unless your doctor or physician assistant tells you not to. If your incision is causing pain, you may take over-the-counter pain relievers such as acetaminophen (Tylenol). If you were prescribed a stronger pain medication, please aware these medication can cause nausea and constipation. Prevent nausea by taking the medication with a snack or meal. Avoid constipation by drinking plenty of fluids and eating foods with high amount of fiber, such as fruits, vegetables, and grains. Take Colace 100 mg (an over-the-counter stool softener) twice a day as needed for constipation.  Do not take Tylenol if you are taking prescription pain medications.  Follow Up  Our office will schedule a follow up appointment 2-3 weeks following discharge.  Please call us immediately for any of the following conditions  Severe or worsening pain in your legs or feet while at rest or while walking Increase pain, redness, warmth, or drainage (pus) from your incision site(s) Fever of 101 degree or higher The swelling in your leg with the bypass suddenly worsens and becomes more painful than when you were in the hospital If you have   been instructed to feel your graft pulse then you should do so every day. If you can no longer feel this pulse, call the office immediately. Not all patients are given this instruction.  Leg swelling is common after leg bypass surgery.  The swelling should improve over a few months following surgery. To improve the swelling, you may elevate your legs above the level of your heart while you are  sitting or resting. Your surgeon or physician assistant may ask you to apply an ACE wrap or wear compression (TED) stockings to help to reduce swelling.  Reduce your risk of vascular disease  Stop smoking. If you would like help call [facility] at 1-800-QUIT-NOW (1-800-784-8669) or [facility] at 336-586-4000.  Manage your cholesterol Maintain a desired weight Control your diabetes weight Control your diabetes Keep your blood pressure down  If you have any questions, please call the office at [phone]  

## 2020-07-29 NOTE — Progress Notes (Signed)
Pt arrived from [facility] via transporter. Pt placed on telemetry, CHG bath, VSS, and oriented to unit. Wife at bedside and educated and plan of care discussed. Explained visitation policy. Pt/wife concerned that three sons were downstairs waiting to come and see pt. Thomas Hoff, RN

## 2020-07-29 NOTE — Evaluation (Signed)
Occupational Therapy Evaluation Patient Details Name: Scott Duarte MRN: [id number] DOB: Mar 30, 1971 Today's Date: 07/29/2020    History of Present Illness 50 yo male presenting with left calf and foot pain. 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 Started on heparin and CT angio ordered to confirm L popliteal artery aneurysm. PMH including tobacca use.   Clinical Impression   PTA, pt was living with his wife and was independent; working full time. Pt currently performing ADLs and functional mobility at [place] I level. Providing education on compensatory techniques for LB dressing and safe tub transfer. Pt demonstrating understanding. VSS. Pt reports minimal pain but feeling very stiff at LLE. Answered all pt questions. Recommend dc home once medically stable per physician. All acute OT needs met and will sign off. Thank you.    Follow Up Recommendations  No OT follow up    Equipment Recommendations  None recommended by OT    Recommendations for Other Services PT consult     Precautions / Restrictions Precautions Precautions: None      Mobility Bed Mobility Overal bed mobility: Modified Independent             General bed mobility comments: increased time No physical A needed    Transfers Overall transfer level: Needs assistance Equipment used: None Transfers: Sit to/from Stand Sit to Stand: Supervision         General transfer comment: Supervision for safety. no physical A needed    Balance Overall balance assessment: Mild deficits observed, not formally tested (USe of RW for comfort)                                         ADL either performed or assessed with clinical judgement   ADL Overall ADL's : Needs assistance/impaired                                       General ADL Comments: Pt performing ADLs and funcitonal mobility at Supervision level. Providing education on  compensatory techniques such as LB dressing techniques or safe tub transfer. Pt demonstrated understanding.     Vision Baseline Vision/History: Wears glasses Patient Visual Report: No change from baseline       Perception     Praxis      Pertinent Vitals/Pain Pain Assessment: Faces Faces Pain Scale: Hurts a little bit Pain Location: LLE Pain Descriptors / Indicators: Discomfort     Hand Dominance Right   Extremity/Trunk Assessment Upper Extremity Assessment Upper Extremity Assessment: Overall WFL for tasks assessed   Lower Extremity Assessment Lower Extremity Assessment: LLE deficits/detail LLE Deficits / Details: pain at LLE LLE Coordination: decreased gross motor   Cervical / Trunk Assessment Cervical / Trunk Assessment: Normal   Communication Communication Communication: No difficulties;Other (comment) (First language spanish; speaks english)   Cognition Arousal/Alertness: Awake/alert Behavior During Therapy: WFL for tasks assessed/performed Overall Cognitive Status: Within Functional Limits for tasks assessed                                     General Comments  VSS on RA. HR 70-80s    Exercises     Shoulder Instructions      Home Living Family/patient expects  to be discharged to:: Private residence Living Arrangements: Spouse/significant other;Children Available Help at Discharge: Family;Available 24 hours/day Type of Home: House Home Access: Level entry     Home Layout: One level     Bathroom Shower/Tub: Teacher, early years/pre: Standard     Home Equipment: None          Prior Functioning/Environment Level of Independence: Independent        Comments: Works as an Wellsite geologist.        OT Problem List: Decreased strength;Decreased range of motion;Decreased activity tolerance;Impaired balance (sitting and/or standing);Decreased knowledge of use of DME or AE;Decreased knowledge of  precautions;Pain      OT Treatment/Interventions:      OT Goals(Current goals can be found in the care plan section) Acute Rehab OT Goals Patient Stated Goal: Go home today OT Goal Formulation: All assessment and education complete, DC therapy  OT Frequency:     Barriers to D/C:            Co-evaluation              AM-PAC OT "6 Clicks" Daily Activity     Outcome Measure Help from another person eating meals?: None Help from another person taking care of personal grooming?: A Little Help from another person toileting, which includes using toliet, bedpan, or urinal?: A Little Help from another person bathing (including washing, rinsing, drying)?: A Little Help from another person to put on and taking off regular upper body clothing?: None Help from another person to put on and taking off regular lower body clothing?: A Little 6 Click Score: 20   End of Session Equipment Utilized During Treatment: Rolling walker Nurse Communication: Mobility status  Activity Tolerance: Patient tolerated treatment well Patient left: in chair;with call bell/phone within reach  OT Visit Diagnosis: Unsteadiness on feet (R26.81);Other abnormalities of gait and mobility (R26.89);Muscle weakness (generalized) (M62.81);Pain Pain - Right/Left: Left Pain - part of body: Leg                Time: 4098-1191 OT Time Calculation (min): 18 min Charges:  OT General Charges $OT Visit: 1 Visit OT Evaluation $OT Eval Low Complexity: [place], OTR/L Acute Rehab Pager: [phone] Office: [place] 07/29/2020, 11:07 AM

## 2020-07-31 ENCOUNTER — Encounter (HOSPITAL_COMMUNITY): Payer: Self-pay | Admitting: Vascular Surgery

## 2020-08-06 ENCOUNTER — Telehealth: Payer: Self-pay

## 2020-08-06 NOTE — Telephone Encounter (Signed)
Patient s/p recent bypass surgery on 4/17 - left VM requesting "a refill" but did not elaborate and was unable to locate patient in Epic based on info in VM and phone number. Received electronic request for pain med RX- then called numbers on file - unable to reach patient- have left VM.

## 2020-08-08 ENCOUNTER — Telehealth: Payer: Self-pay

## 2020-08-08 NOTE — Telephone Encounter (Signed)
Patient called to ask if swelling was normal s/p bypass surgery on 07/28/20. He is able to move his legs, denies pain, redness, color or temperature changes. Advised him that swelling was normal and would take some time to resolve. I then transferred his call to Neysa Bonito so she could assist him with paperwork for his job.

## 2020-08-16 ENCOUNTER — Telehealth (HOSPITAL_COMMUNITY): Payer: Self-pay

## 2020-08-16 NOTE — Telephone Encounter (Signed)
All FMLA paper was completed and faxed to Glendale Chard at [employer] [phone]  [facility]

## 2020-08-23 ENCOUNTER — Other Ambulatory Visit: Payer: Self-pay

## 2020-08-23 ENCOUNTER — Ambulatory Visit (INDEPENDENT_AMBULATORY_CARE_PROVIDER_SITE_OTHER): Payer: Managed Care, Other (non HMO) | Admitting: Physician Assistant

## 2020-08-23 VITALS — BP 128/80 | HR 88 | Temp 97.3°F | Resp 20 | Ht 68.0 in | Wt 238.9 lb

## 2020-08-23 DIAGNOSIS — I724 Aneurysm of artery of lower extremity: Secondary | ICD-10-CM

## 2020-08-23 NOTE — Progress Notes (Signed)
    Postoperative Visit   History of Present Illness   Scott Duarte is a 50 y.o. year old male who presents for postoperative follow-up for left above-the-knee popliteal to below the knee popliteal artery bypass with vein due to symptomatic left popliteal artery aneurysm by Dr. Randie Heinz on 07/28/2020.  Patient states the discoloration in his left foot has completely resolved since surgery.  He denies any claudication, rest pain, or nonhealing wounds.  He is taking an aspirin and statin daily.  He does complain of left lower extremity edema however read that this can be normal after bypass surgery.  Preop CTA aorta with runoff showed right SFA stenosis but no popliteal aneurysm on the right.  For VQI Use Only   PRE-ADM LIVING: Home  AMB STATUS: Ambulatory   Physical Examination   Vitals:   08/23/20 1023  BP: 128/80  Pulse: 88  Resp: 20  Temp: (!) 97.3 F (36.3 C)  TempSrc: Temporal  SpO2: 99%  Weight: 238 lb 14.4 oz (108.4 kg)  Height: 5\' 8"  (1.727 m)    LLE: Incisions are healed, palpable L DP pulse; LLE edematous to level of the distal thigh   Medical Decision Making   Scott Duarte is a 50 y.o. year old male who presents s/p above-the-knee popliteal to below the knee popliteal artery bypass with vein due to symptomatic popliteal aneurysm.  44 Left foot is well-perfused with a palpable DP pulse indicating that bypass graft is patent . All incisions of left lower extremity appear to be healing well . Mobility is still limited due to surgical pain as well as left lower extremity edema . I encouraged the patient to elevate his leg when he is not walking around; he can begin to increase his activity level if he can tolerate.  I recommend that patient stay out of work until June 3 due to symptoms related to surgery that were indicated above . Plan is to check bypass duplex in another month per protocol   12-28-1998 PA-C [facility] [place] Office:  [phone]  Clinic MD: [phone]

## 2020-08-26 ENCOUNTER — Other Ambulatory Visit: Payer: Self-pay | Admitting: *Deleted

## 2020-08-26 DIAGNOSIS — I724 Aneurysm of artery of lower extremity: Secondary | ICD-10-CM

## 2020-08-29 ENCOUNTER — Telehealth (HOSPITAL_COMMUNITY): Payer: Self-pay

## 2020-08-29 NOTE — Telephone Encounter (Signed)
All paper work was faxed to [facility] @ [phone].[employer].    [employer]

## 2020-09-04 ENCOUNTER — Encounter: Payer: Self-pay | Admitting: Physician Assistant

## 2020-09-25 ENCOUNTER — Other Ambulatory Visit: Payer: Self-pay

## 2020-09-25 ENCOUNTER — Ambulatory Visit (INDEPENDENT_AMBULATORY_CARE_PROVIDER_SITE_OTHER): Payer: Managed Care, Other (non HMO) | Admitting: Physician Assistant

## 2020-09-25 ENCOUNTER — Ambulatory Visit (INDEPENDENT_AMBULATORY_CARE_PROVIDER_SITE_OTHER)
Admission: RE | Admit: 2020-09-25 | Discharge: 2020-09-25 | Disposition: A | Discharge status: Home / Self Care | Payer: Managed Care, Other (non HMO) | Source: Ambulatory Visit | Attending: Vascular Surgery | Admitting: Vascular Surgery

## 2020-09-25 ENCOUNTER — Ambulatory Visit (HOSPITAL_COMMUNITY)
Admission: RE | Admit: 2020-09-25 | Discharge: 2020-09-25 | Disposition: A | Discharge status: Home / Self Care | Payer: Managed Care, Other (non HMO) | Source: Ambulatory Visit | Attending: Vascular Surgery | Admitting: Vascular Surgery

## 2020-09-25 VITALS — BP 124/76 | HR 76 | Temp 97.9°F | Resp 20 | Ht 68.0 in | Wt 231.0 lb

## 2020-09-25 DIAGNOSIS — I724 Aneurysm of artery of lower extremity: Secondary | ICD-10-CM

## 2020-09-25 NOTE — Progress Notes (Signed)
Peripheral Arterial Disease Follow-Up   VASCULAR SURGERY ASSESSMENT & PLAN:   Scott Duarte is a 50 y.o. 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 Randie Heinz on 07/28/2020  Normal ABIs and patent left lower extremity femoropopliteal bypass.  No evidence of right popliteal artery abnormality on CTA while inpatient.  Patient is without claudication or rest pain. Mobility has improved.  He may return to full activity.  Recommended elevating left lower extremity above heart level daily and to apply compressive knee-high stocking in the morning and removing at night.  Continue optimal medical management of diabetes, hypertension and follow-up with primary care physician. Encouraged continuation of complete smoking cessation. Continue the following medications: Statin and aspirin Follow-up in 9 months with ABIs and left lower extremity duplex.  He is encouraged to call our office should he develop worsening lower extremity pain or skin issues.  SUBJECTIVE:   He reports left lower extremity edema at the end of the day.  He is exercising and ambulating without difficulty.  PHYSICAL EXAM:     General appearance: Well-developed, well-nourished in no apparent distress Neurologic: Alert and oriented x 4. Cardiovascular: Heart rate and rhythm are regular.   Extremities: Skin intact.  Both feet are warm and well perfused.  Motor function and sensation intact.  All left lower extremity incisions have healed. Pulse exam: 2+ femoral, dorsalis pedis, posterior tibial pulses bilaterally   NON-INVASIVE VASCULAR STUDIES   09/25/2020 ABIs ABI/TBIToday's ABIToday's TBIPrevious ABIPrevious TBI  +-------+-----------+-----------+------------+------------+  Right  1.05       0.64                                 +-------+-----------+-----------+------------+------------+  Left   1.18       0.87         Right  great toe pressure 82.  Waveforms are triphasic. Left great toe pressure is 112.  Waveforms are triphasic  Arterial duplex: Left: Patent above knee and below knee bypass graft with no evidence of  arterial occlusive disease.   PROBLEM LIST:    The patient's past medical history, past surgical history, family history, social history, allergy list and medication list are reviewed.   CURRENT MEDS:    Current Outpatient Medications:    aspirin 81 MG tablet, Take 81 mg by mouth daily. Reported on 04/18/2015, Disp: , Rfl:    atorvastatin (LIPITOR) 20 MG tablet, Take 1 tablet (20 mg total) by mouth daily., Disp: 90 tablet, Rfl: 3   Continuous Blood Gluc Receiver (FREESTYLE LIBRE 2 READER) DEVI, Scan as needed for continuous glucose monitoring., Disp: , Rfl:    meloxicam (MOBIC) 15 MG tablet, Take 1 tablet by mouth daily., Disp: , Rfl:    metFORMIN (GLUMETZA) 500 MG (MOD) 24 hr tablet, Take 500 mg by mouth daily with breakfast., Disp: , Rfl:    methylPREDNIsolone (MEDROL DOSPACK) 4 MG tablet, follow package directions, Disp: 21 tablet, Rfl: 0   oxyCODONE-acetaminophen (PERCOCET) 5-325 MG tablet, Take 1 tablet by mouth every 6 (six) hours as needed., Disp: 20 tablet, Rfl: 0   REVIEW OF SYSTEMS:   [X]  denotes positive finding, [ ]  denotes negative finding Cardiac  Comments:  Chest pain or chest pressure:    Shortness of breath upon exertion:    Short of breath when lying flat:    Irregular heart rhythm:  Vascular    Pain in calf, thigh, or hip brought on by ambulation:    Pain in feet at night that wakes you up from your sleep:     Blood clot in your veins:    Leg swelling:         Pulmonary    Oxygen at home:    Productive cough:     Wheezing:         Neurologic    Sudden weakness in arms or legs:     Sudden numbness in arms or legs:     Sudden onset of difficulty speaking or slurred speech:    Temporary loss of vision in one eye:     Problems with dizziness:          Gastrointestinal    Blood in stool:     Vomited blood:         Genitourinary    Burning when urinating:     Blood in urine:        Psychiatric    Major depression:         Hematologic    Bleeding problems:    Problems with blood clotting too easily:        Skin    Rashes or ulcers:        Constitutional    Fever or chills:     Milinda Antis, PA-C  Office: [phone] 09/25/2020  Clinic MD: Dr. Myra Gianotti on-call

## 2020-09-26 ENCOUNTER — Other Ambulatory Visit: Payer: Self-pay

## 2020-09-26 DIAGNOSIS — I724 Aneurysm of artery of lower extremity: Secondary | ICD-10-CM

## 2020-09-27 ENCOUNTER — Ambulatory Visit: Payer: Managed Care, Other (non HMO)

## 2020-09-27 ENCOUNTER — Other Ambulatory Visit (HOSPITAL_COMMUNITY): Payer: Managed Care, Other (non HMO)

## 2020-09-27 ENCOUNTER — Encounter (HOSPITAL_COMMUNITY): Payer: Managed Care, Other (non HMO)

## 2022-09-08 IMAGING — CT CT ANGIO AOBIFEM WO/W CM
1 of 7 series · 4 of 16 positions shown, 5 images · IV contrast (OMNI 350)
Comparison: None.

CLINICAL DATA: Left leg pain for 1 week. Blue toes. Venous Doppler
positive for DVT and possible popliteal artery aneurysm.

EXAM:
CT ANGIOGRAPHY OF ABDOMINAL AORTA WITH ILIOFEMORAL RUNOFF
TECHNIQUE: Multidetector CT imaging of the abdomen, pelvis and lower
extremities was performed using the standard protocol during bolus
administration of intravenous contrast. Multiplanar CT image
reconstructions and MIPs were obtained to evaluate the vascular
anatomy.
CONTRAST:  100mL OMNIPAQUE IOHEXOL 350 MG/ML SOLN

[Series 5: cta runoff (id) · axial · 0.98mm/px · z∈[+284,+1166]mm · 4 of 490 slices shown, 5 images]
[im 98/490  soft-tissue]
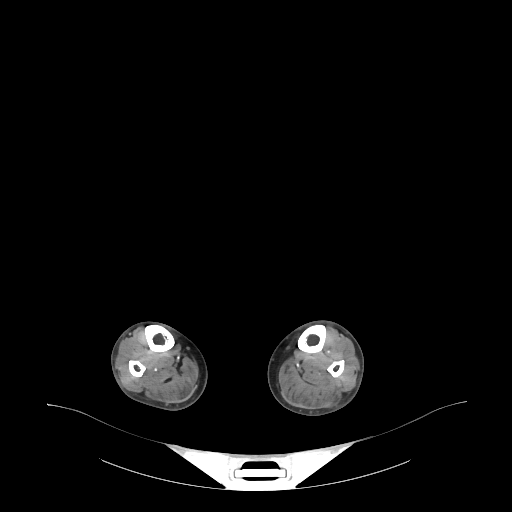
[im 98/490  bone]
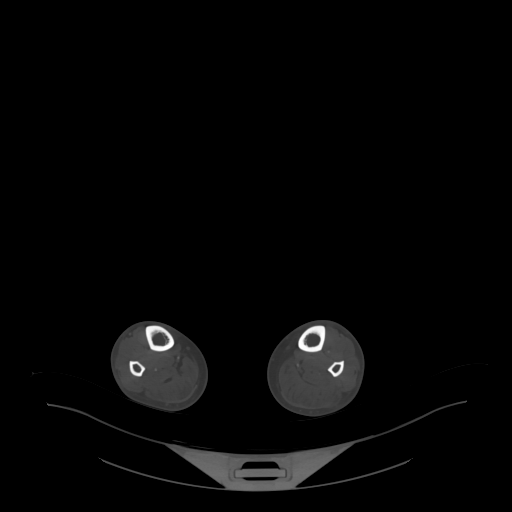
[im 196/490  soft-tissue]
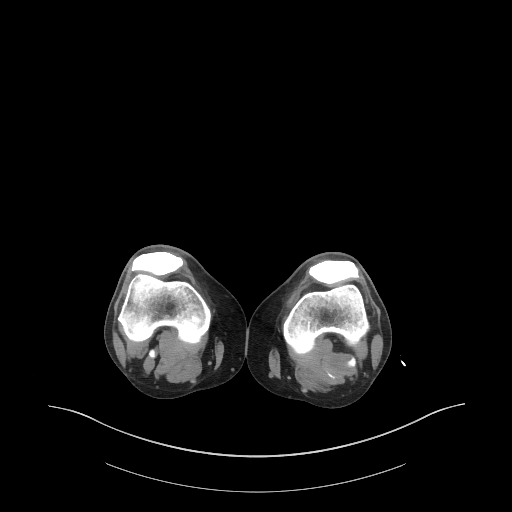
[im 294/490  soft-tissue]
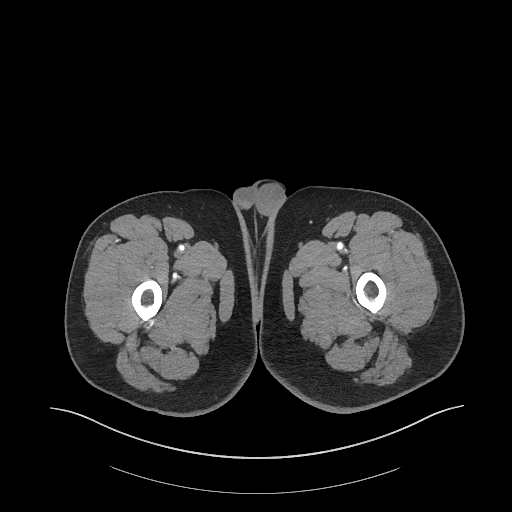
[im 392/490  soft-tissue]
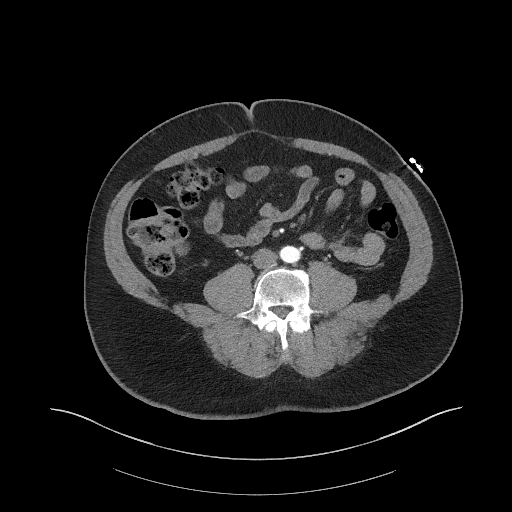

[4 of 16 positions shown; findings below may reference images not displayed]

FINDINGS: VASCULAR

Aorta: Normal caliber aorta without aneurysm, dissection, vasculitis
or significant stenosis.

Celiac: Patent without evidence of aneurysm, dissection, vasculitis
or significant stenosis.

SMA: Patent without evidence of aneurysm, dissection, vasculitis or
significant stenosis.

Renals: Both renal arteries are patent without evidence of aneurysm,
dissection, vasculitis, fibromuscular dysplasia or significant
stenosis.

IMA: Patent without evidence of aneurysm, dissection, vasculitis or
significant stenosis.

RIGHT Lower Extremity

Inflow: Common, internal and external iliac arteries are patent
without evidence of aneurysm, dissection, vasculitis or significant
stenosis.

Outflow: No significant abnormality of the common femoral or
Profunda femoris arteries. There is approximately 60% focal stenosis
of the mid SFA due to atheromatous plaque. No significant narrowing
of the popliteal artery.

Runoff: No significant narrowing of the tibioperoneal trunk. Two
vessel runoff to the ankle through the peroneal and posterior tibial
arteries. The anterior tibial artery opacifies to the distal lower
leg, however the dorsalis pedis artery is predominantly supplied by
the peroneal.

LEFT Lower Extremity

Inflow: Common, internal and external iliac arteries are patent
without evidence of aneurysm, dissection, vasculitis or significant
stenosis.

Outflow: No significant abnormality of the common femoral,
superficial femoral, or profunda femoris arteries.

There is aneurysmal dilatation of the popliteal artery measuring
x 2.6 cm.

Runoff: Tibioperoneal trunk is patent. Three-vessel runoff to the
left ankle.

Veins: No obvious venous abnormality within the limitations of this
arterial phase study.

Review of the MIP images confirms the above findings.

NON-VASCULAR

Lower chest: Multiple nodules noted in the right lung base on series
5:

Image 18-right middle lobe-2 mm

Image 22-right middle lobe-2 mm

Image 29-right lower lobe-5 mm

Hepatobiliary: Diffuse low-density of hepatic parenchyma indicative
of steatosis. No focal hepatic lesion. Nonspecific mild diffuse
thickening of the gallbladder wall.

Pancreas: Unremarkable. No pancreatic ductal dilatation or
surrounding inflammatory changes.

Spleen: Normal in size without focal abnormality.

Adrenals/Urinary Tract: Adrenal glands are normal in appearance.
Subcentimeter hypodense lesion in the upper pole of the left kidney
is too small to fully characterize. Kidneys and ureters otherwise
unremarkable. Bladder is unremarkable.

Stomach/Bowel: Stomach is within normal limits. Appendix is not
definitively identified. No evidence of bowel wall thickening,
distention, or inflammatory changes.

Lymphatic: No enlarged abdominal or pelvic lymph nodes.

Reproductive: Moderately enlarged prostate.

Other: No abdominal wall hernia or abnormality. No abdominopelvic
ascites.

Musculoskeletal: No acute or significant osseous findings.
IMPRESSION: VASCULAR

1. Partially thrombosed 3.4 x 2.6 cm left popliteal artery aneurysm.
2. Focal stenosis of the mid right superficial femoral artery
measuring approximately 60%.

NON-VASCULAR

Multiple noncalcified right lower lobe lung nodules measuring up to
5 mm.

No follow-up needed if patient is low-risk (and has no known or
suspected primary neoplasm). Non-contrast chest CT can be considered
in 12 months if patient is high-risk. This recommendation follows
the consensus statement: Guidelines for Management of Incidental
Pulmonary Nodules Detected on CT Images: From the [HOSPITAL]
